# Patient Record
Sex: Female | Born: 1955 | Race: Black or African American | Hispanic: No | Marital: Single | Smoking: Never smoker
Health system: Southern US, Community
[De-identification: ages and names within clinical notes are randomized; demographics above are authoritative.]

## PROBLEM LIST (undated history)

## (undated) DIAGNOSIS — Z789 Other specified health status: Secondary | ICD-10-CM

## (undated) DIAGNOSIS — R87619 Unspecified abnormal cytological findings in specimens from cervix uteri: Secondary | ICD-10-CM

## (undated) DIAGNOSIS — R87629 Unspecified abnormal cytological findings in specimens from vagina: Secondary | ICD-10-CM

## (undated) DIAGNOSIS — R232 Flushing: Secondary | ICD-10-CM

## (undated) DIAGNOSIS — Z79890 Hormone replacement therapy (postmenopausal): Secondary | ICD-10-CM

## (undated) DIAGNOSIS — R195 Other fecal abnormalities: Secondary | ICD-10-CM

## (undated) HISTORY — DX: Unspecified abnormal cytological findings in specimens from vagina: R87.629

## (undated) HISTORY — DX: Other fecal abnormalities: R19.5

## (undated) HISTORY — PX: BREAST BIOPSY: SHX20

## (undated) HISTORY — DX: Flushing: R23.2

## (undated) HISTORY — PX: TUBAL LIGATION: SHX77

## (undated) HISTORY — DX: Unspecified abnormal cytological findings in specimens from cervix uteri: R87.619

## (undated) HISTORY — PX: ABDOMINAL SURGERY: SHX537

## (undated) HISTORY — DX: Hormone replacement therapy: Z79.890

---

## 2002-01-22 ENCOUNTER — Emergency Department (HOSPITAL_COMMUNITY): Admission: EM | Admit: 2002-01-22 | Discharge: 2002-01-23 | Payer: Self-pay | Admitting: Internal Medicine

## 2002-01-23 ENCOUNTER — Encounter: Payer: Self-pay | Admitting: Internal Medicine

## 2002-04-28 ENCOUNTER — Emergency Department (HOSPITAL_COMMUNITY): Admission: EM | Admit: 2002-04-28 | Discharge: 2002-04-28 | Payer: Self-pay | Admitting: Emergency Medicine

## 2002-04-28 ENCOUNTER — Encounter: Payer: Self-pay | Admitting: Emergency Medicine

## 2003-02-08 ENCOUNTER — Ambulatory Visit (HOSPITAL_COMMUNITY): Admission: RE | Admit: 2003-02-08 | Discharge: 2003-02-08 | Payer: Self-pay | Admitting: Obstetrics & Gynecology

## 2003-02-08 ENCOUNTER — Encounter: Payer: Self-pay | Admitting: Obstetrics & Gynecology

## 2003-04-12 ENCOUNTER — Emergency Department (HOSPITAL_COMMUNITY): Admission: EM | Admit: 2003-04-12 | Discharge: 2003-04-12 | Payer: Self-pay | Admitting: Emergency Medicine

## 2003-09-03 ENCOUNTER — Emergency Department (HOSPITAL_COMMUNITY): Admission: EM | Admit: 2003-09-03 | Discharge: 2003-09-03 | Payer: Self-pay | Admitting: Emergency Medicine

## 2004-02-16 ENCOUNTER — Ambulatory Visit (HOSPITAL_COMMUNITY): Admission: RE | Admit: 2004-02-16 | Discharge: 2004-02-16 | Payer: Self-pay | Admitting: Obstetrics and Gynecology

## 2004-07-25 ENCOUNTER — Emergency Department (HOSPITAL_COMMUNITY): Admission: EM | Admit: 2004-07-25 | Discharge: 2004-07-25 | Payer: Self-pay | Admitting: Emergency Medicine

## 2005-02-12 ENCOUNTER — Ambulatory Visit (HOSPITAL_COMMUNITY): Admission: RE | Admit: 2005-02-12 | Discharge: 2005-02-12 | Payer: Self-pay | Admitting: Obstetrics and Gynecology

## 2005-03-28 ENCOUNTER — Emergency Department (HOSPITAL_COMMUNITY): Admission: EM | Admit: 2005-03-28 | Discharge: 2005-03-28 | Payer: Self-pay | Admitting: Emergency Medicine

## 2005-03-29 ENCOUNTER — Encounter: Admission: RE | Admit: 2005-03-29 | Discharge: 2005-03-29 | Payer: Self-pay | Admitting: Oncology

## 2005-03-29 ENCOUNTER — Encounter (HOSPITAL_COMMUNITY): Admission: RE | Admit: 2005-03-29 | Discharge: 2005-04-28 | Payer: Self-pay | Admitting: Oncology

## 2005-03-29 ENCOUNTER — Ambulatory Visit (HOSPITAL_COMMUNITY): Payer: Self-pay | Admitting: Orthopaedic Surgery

## 2005-04-01 ENCOUNTER — Ambulatory Visit (HOSPITAL_COMMUNITY): Payer: Self-pay | Admitting: Oncology

## 2006-04-01 ENCOUNTER — Emergency Department (HOSPITAL_COMMUNITY): Admission: EM | Admit: 2006-04-01 | Discharge: 2006-04-01 | Payer: Self-pay | Admitting: Emergency Medicine

## 2006-04-28 ENCOUNTER — Ambulatory Visit (HOSPITAL_COMMUNITY): Admission: RE | Admit: 2006-04-28 | Discharge: 2006-04-28 | Payer: Self-pay | Admitting: Obstetrics and Gynecology

## 2007-05-19 ENCOUNTER — Ambulatory Visit (HOSPITAL_COMMUNITY): Admission: RE | Admit: 2007-05-19 | Discharge: 2007-05-19 | Payer: Self-pay | Admitting: Obstetrics and Gynecology

## 2007-07-15 ENCOUNTER — Other Ambulatory Visit: Admission: RE | Admit: 2007-07-15 | Discharge: 2007-07-15 | Payer: Self-pay | Admitting: Obstetrics and Gynecology

## 2008-05-23 ENCOUNTER — Ambulatory Visit (HOSPITAL_COMMUNITY): Admission: RE | Admit: 2008-05-23 | Discharge: 2008-05-23 | Payer: Self-pay | Admitting: Obstetrics and Gynecology

## 2008-11-29 ENCOUNTER — Other Ambulatory Visit: Admission: RE | Admit: 2008-11-29 | Discharge: 2008-11-29 | Payer: Self-pay | Admitting: Obstetrics and Gynecology

## 2009-10-29 ENCOUNTER — Emergency Department (HOSPITAL_COMMUNITY): Admission: EM | Admit: 2009-10-29 | Discharge: 2009-10-29 | Payer: Self-pay | Admitting: Emergency Medicine

## 2010-05-09 ENCOUNTER — Other Ambulatory Visit: Admission: RE | Admit: 2010-05-09 | Discharge: 2010-05-09 | Payer: Self-pay | Admitting: Obstetrics & Gynecology

## 2010-11-03 ENCOUNTER — Encounter: Payer: Self-pay | Admitting: Specialist

## 2010-11-03 ENCOUNTER — Encounter: Payer: Self-pay | Admitting: Internal Medicine

## 2011-02-28 NOTE — Consult Note (Signed)
NAMEMarland Krueger  [name], [name]            ACCOUNT NO.:  [id number]   MEDICAL RECORD NO.:  [id number]          PATIENT TYPE:  EMS   LOCATION:  ED                            FACILITY:  [facility]   PHYSICIAN:  J. Darreld Mclean, M.D. DATE OF BIRTH:  1956/06/06   DATE OF CONSULTATION:  03/28/2005  DATE OF DISCHARGE:                                   CONSULTATION   ORTHOPEDIC SURGERY CONSULTATION:  Patient was seen in the ER.  Requested by  the ER physician.   Patient is a 55 year old female who got a splinter caught in her left thumb  area near the metacarpophalangeal joint Sunday this week, today is Friday,  she has had pain, tenderness and swelling, she thought she got the splinter  out but she has swelling.  ER physician looked at it, he called Dr.  Metro Kung on call for hand and my partners asked me to take a look at it.  Patient has got a very small little punctate area with yellow and then some  slight redness around it, her white count is normal, her temperature is  afebrile.  Range of motion of the thumb is full but it is somewhat tender.   With patient permission I used a small 21-gauge needle and made a small  little nick over the area where the yellow part was, applied a little  pressure and got some purulent yellow material out, no evidence of any  splinter was seen, cleansed the area and put a dressing on it and put it in  a thumb splint, began IV Rocephin, given IV Rocephin 1 g today and daily.  I  will see her in the office on Monday, patient information booklet given,  prescription for Vicodin ES was given, if this gets worse we may need to do  a formal surgical debridement in the OR, to try to make an incision to look  for a small splinter would be very difficult here since the area is so  small.  She said she picked at it earlier the other day and she made  herself a superficial infection, and there may indeed be a small foreign  body.  The x-rays are negative of course.  If any  trouble or difficulty come  back to the emergency room this weekend, she will be coming back daily for  IV antibiotics and I will see her on Monday.       JWK/MEDQ  D:  03/28/2005  T:  03/28/2005  Job:  [id number]

## 2011-06-10 ENCOUNTER — Encounter: Payer: Self-pay | Admitting: Emergency Medicine

## 2011-06-10 ENCOUNTER — Emergency Department (HOSPITAL_COMMUNITY): Payer: Medicaid Other

## 2011-06-10 ENCOUNTER — Emergency Department (HOSPITAL_COMMUNITY)
Admission: EM | Admit: 2011-06-10 | Discharge: 2011-06-10 | Disposition: A | Discharge status: Home / Self Care | Payer: Medicaid Other | Attending: Emergency Medicine | Admitting: Emergency Medicine

## 2011-06-10 DIAGNOSIS — J069 Acute upper respiratory infection, unspecified: Secondary | ICD-10-CM | POA: Insufficient documentation

## 2011-06-10 MED ORDER — OXYMETAZOLINE HCL 0.05 % NA SOLN: 2.0000 | Freq: Two times a day (BID) | NASAL | Status: AC

## 2011-06-10 MED ORDER — PREDNISONE 10 MG PO TABS: ORAL_TABLET | ORAL | Status: DC

## 2011-06-10 MED ORDER — PROMETHAZINE-CODEINE 6.25-10 MG/5ML PO SYRP: 5.0000 mL | ORAL_SOLUTION | Freq: Four times a day (QID) | ORAL | Status: AC | PRN
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
## 2011-06-10 NOTE — ED Notes (Signed)
Pt c/o productive yellow sputum cough x 1 week. C/o soreness to ears, chest and head also. Nad. No resp distress noted.

## 2011-06-11 NOTE — ED Provider Notes (Signed)
Medical screening examination/treatment/procedure(s) were performed by non-physician practitioner and as supervising physician I was immediately available for consultation/collaboration.  Tiesha Marich, MD 06/11/11 2140 

## 2011-07-30 ENCOUNTER — Other Ambulatory Visit (HOSPITAL_COMMUNITY): Payer: Self-pay | Admitting: Family Medicine

## 2011-07-30 DIAGNOSIS — Z139 Encounter for screening, unspecified: Secondary | ICD-10-CM

## 2011-08-04 ENCOUNTER — Ambulatory Visit (HOSPITAL_COMMUNITY)
Admission: RE | Admit: 2011-08-04 | Discharge: 2011-08-04 | Disposition: A | Discharge status: Home / Self Care | Payer: Medicaid Other | Source: Ambulatory Visit | Attending: Family Medicine | Admitting: Family Medicine

## 2011-08-04 DIAGNOSIS — Z139 Encounter for screening, unspecified: Secondary | ICD-10-CM

## 2011-08-04 DIAGNOSIS — Z1231 Encounter for screening mammogram for malignant neoplasm of breast: Secondary | ICD-10-CM | POA: Insufficient documentation

## 2012-06-22 ENCOUNTER — Telehealth: Payer: Self-pay | Admitting: *Deleted

## 2012-06-22 NOTE — Telephone Encounter (Signed)
Michelle Krueger called to set up her colonoscopy. Please call her back. Thanks.

## 2012-06-22 NOTE — Telephone Encounter (Signed)
Gastroenterology Pre-Procedure Form    Request Date: 06/22/2012      Requesting Physician: Dr. Felecia Shelling     PATIENT INFORMATION:  Michelle Krueger is a 56 y.o., female (DOB=09-Jul-1956).  PROCEDURE: Procedure(s) requested: colonoscopy Procedure Reason: screening for colon cancer  PATIENT REVIEW QUESTIONS: The patient reports the following:   1. Diabetes Melitis: no 2. Joint replacements in the past 12 months: no 3. Major health problems in the past 3 months: no 4. Has an artificial valve or MVP:no 5. Has been advised in past to take antibiotics in advance of a procedure like teeth cleaning: no}    MEDICATIONS & ALLERGIES:    Patient reports the following regarding taking any blood thinners:   Plavix? no Aspirin?no Coumadin?  no  Patient confirms/reports the following medications:  Current Outpatient Prescriptions  Medication Sig Dispense Refill  . dextromethorphan (DELSYM) 30 MG/5ML liquid Take 60 mg by mouth 2 (two) times daily as needed. For cough       . predniSONE (DELTASONE) 10 MG tablet 6,5,4,3,2,1 - take wth food  21 tablet  0    Patient confirms/reports the following allergies:  Allergies  Allergen Reactions  . Ibuprofen Rash    Patient is appropriate to schedule for requested procedure(s): yes  AUTHORIZATION INFORMATION Primary Insurance:  ID #: Group #:  Pre-Cert / Auth required: Pre-Cert / Auth #:   Secondary Insurance:   ID #:   Group #:  Pre-Cert / Auth required: Pre-Cert / Auth #:   No orders of the defined types were placed in this encounter.    SCHEDULE INFORMATION: Procedure has been scheduled as follows:  Date:               Time:   Location: [facility] Short Stay  This Gastroenterology Pre-Precedure Form is being routed to the following provider(s) for review: Jonette Eva, MD

## 2012-06-22 NOTE — Telephone Encounter (Signed)
PREPOPIK-DRINK WATER TO KEEP URINE LIGHT YELLOW.  PT SHOULD DROP OFF RX 3 DAYS PRIOR TO PROCEDURE.  

## 2012-06-22 NOTE — Telephone Encounter (Signed)
Returned pt's call. Many rings and no answer.  

## 2012-06-23 ENCOUNTER — Other Ambulatory Visit: Payer: Self-pay

## 2012-06-23 DIAGNOSIS — Z139 Encounter for screening, unspecified: Secondary | ICD-10-CM

## 2012-06-23 MED ORDER — SOD PICOSULFATE-MAG OX-CIT ACD 10-3.5-12 MG-GM-GM PO PACK: 1.0000 | PACK | Freq: Once | ORAL | Status: DC

## 2012-06-23 NOTE — Telephone Encounter (Signed)
See separate note. Rx sent in and instructions mailed to pt.

## 2012-06-23 NOTE — Telephone Encounter (Signed)
Called to tell pt date and time of procedure. Could not leave message. Mailing the info and Rx being sent to [facility]. She is scheduled for 07/16/2012 @ 9:30 AM with Dr. Darrick Penna for her colonoscopy.

## 2012-06-28 ENCOUNTER — Other Ambulatory Visit: Payer: Self-pay | Admitting: Obstetrics and Gynecology

## 2012-06-28 DIAGNOSIS — Z139 Encounter for screening, unspecified: Secondary | ICD-10-CM

## 2012-07-13 ENCOUNTER — Encounter (HOSPITAL_COMMUNITY): Payer: Self-pay | Admitting: Pharmacy Technician

## 2012-07-16 ENCOUNTER — Ambulatory Visit (HOSPITAL_COMMUNITY)
Admission: RE | Admit: 2012-07-16 | Discharge: 2012-07-16 | Disposition: A | Discharge status: Home / Self Care | Payer: Medicaid Other | Source: Ambulatory Visit | Attending: Gastroenterology | Admitting: Gastroenterology

## 2012-07-16 ENCOUNTER — Encounter (HOSPITAL_COMMUNITY): Payer: Self-pay | Admitting: *Deleted

## 2012-07-16 ENCOUNTER — Encounter (HOSPITAL_COMMUNITY)
Admission: RE | Disposition: A | Discharge status: Home / Self Care | Payer: Self-pay | Source: Ambulatory Visit | Attending: Gastroenterology

## 2012-07-16 DIAGNOSIS — D126 Benign neoplasm of colon, unspecified: Secondary | ICD-10-CM

## 2012-07-16 DIAGNOSIS — K648 Other hemorrhoids: Secondary | ICD-10-CM

## 2012-07-16 DIAGNOSIS — Z1211 Encounter for screening for malignant neoplasm of colon: Secondary | ICD-10-CM | POA: Insufficient documentation

## 2012-07-16 DIAGNOSIS — Z139 Encounter for screening, unspecified: Secondary | ICD-10-CM

## 2012-07-16 HISTORY — PX: COLONOSCOPY: SHX5424

## 2012-07-16 HISTORY — DX: Other specified health status: Z78.9

## 2012-07-16 SURGERY — COLONOSCOPY
Anesthesia: Moderate Sedation

## 2012-07-16 MED ORDER — MIDAZOLAM HCL 5 MG/5ML IJ SOLN
INTRAMUSCULAR | Status: DC | PRN
  Administered 2012-07-16 (×2): 2 mg via INTRAVENOUS

## 2012-07-16 MED ORDER — MEPERIDINE HCL 100 MG/ML IJ SOLN
INTRAMUSCULAR | Status: AC
  Filled 2012-07-16: qty 1

## 2012-07-16 MED ORDER — MEPERIDINE HCL 100 MG/ML IJ SOLN
INTRAMUSCULAR | Status: DC | PRN
  Administered 2012-07-16 (×2): 25 mg via INTRAVENOUS

## 2012-07-16 MED ORDER — SODIUM CHLORIDE 0.45 % IV SOLN
INTRAVENOUS | Status: DC
  Administered 2012-07-16: 10:00:00 via INTRAVENOUS

## 2012-07-16 MED ORDER — STERILE WATER FOR IRRIGATION IR SOLN
Status: DC | PRN
  Administered 2012-07-16: 10:00:00

## 2012-07-16 MED ORDER — MIDAZOLAM HCL 5 MG/5ML IJ SOLN
INTRAMUSCULAR | Status: AC
  Filled 2012-07-16: qty 10

## 2012-07-16 NOTE — Op Note (Signed)
[facility] [address] [place], [address]   COLONOSCOPY PROCEDURE REPORT  PATIENT: Michelle Krueger, Michelle Krueger  MR#: [id number] BIRTHDATE: 06/06/1956 , 56  yrs. old GENDER: Female ENDOSCOPIST: Jonette Eva, MD REFERRED XB:[id number] Fanta, M.D. PROCEDURE DATE:  07/16/2012 PROCEDURE:   Colonoscopy with biopsy INDICATIONS:average risk patient for colon cancer. MEDICATIONS: Demerol 50 mg IV and Versed 4 mg IV  DESCRIPTION OF PROCEDURE:    Physical exam was performed.  Informed consent was obtained from the patient after explaining the benefits, risks, and alternatives to procedure.  The patient was connected to monitor and placed in left lateral position. Continuous oxygen was provided by nasal cannula and IV medicine administered through an indwelling cannula.  After administration of sedation and rectal exam, the patients rectum was intubated and the EC-[id number] ([id number])  colonoscope was advanced under direct visualization to the cecum.  The scope was removed slowly by carefully examining the color, texture, anatomy, and integrity mucosa on the way out.  The patient was recovered in endoscopy and discharged home in satisfactory condition.       COLON FINDINGS: A sessile polyp ranging between 3-85mm in size was found in the sigmoid colon.  A polypectomy was performed with cold forceps.  , The colon was otherwise normal.  There was no diverticulosis, inflammation, polyps or cancers unless previously stated.  , and Small internal hemorrhoids were found.    REDUNDANT SIGMOID/TRANSVERSE COLON.  PREP QUALITY: excellent. CECAL W/D TIME: 12 minutes  COMPLICATIONS: None  ENDOSCOPIC IMPRESSION: 1.   Sessile polyp ranging between 3-72mm in size was found in the sigmoid colon; polypectomy was performed with cold forceps 2.   The colon was otherwise normal 3.   Small internal hemorrhoids   RECOMMENDATIONS: FOLLOW A HIGH FIBER DIET.  Avoid items that cause  bloating.  BIOPSY RESULTS SHOULD BE BACK IN 7 DAYS.  Next colonoscopy in 10 yearS WITH AN OVERTUBE.       _______________________________ Rosalie DoctorJonette Eva, MD 07/16/2012 3:19 PM     PATIENT NAME:  Michelle, Krueger MR#: [id number]

## 2012-07-16 NOTE — H&P (Signed)
  Primary Care Physician:  Avon Gully, MD Primary Gastroenterologist:  Dr. Darrick Penna  Pre-Procedure History & Physical: HPI:  Michelle Krueger is a 56 y.o. female here for COLON CANCER SCREENING.   Past Medical History  Diagnosis Date  . No pertinent past medical history     Past Surgical History  Procedure Date  . Tubal ligation     Prior to Admission medications   Not on File    Allergies as of 06/23/2012 - Review Complete 06/22/2012  Allergen Reaction Noted  . Ibuprofen Rash 06/10/2011    History reviewed. No pertinent family history.  History   Social History  . Marital Status: Single    Spouse Name: N/A    Number of Children: N/A  . Years of Education: N/A   Occupational History  . Not on file.   Social History Main Topics  . Smoking status: Never Smoker   . Smokeless tobacco: Not on file  . Alcohol Use: No  . Drug Use: No  . Sexually Active:    Other Topics Concern  . Not on file   Social History Narrative  . No narrative on file    Review of Systems: See HPI, otherwise negative ROS   Physical Exam: BP 141/80  Pulse 74  Temp 97.5 F (36.4 C) (Oral)  Resp 17  Ht 4\' 9"  (1.448 m)  Wt 128 lb (58.06 kg)  BMI 27.70 kg/m2  SpO2 98%  LMP 04/29/2011 General:   Alert,  pleasant and cooperative in NAD Head:  Normocephalic and atraumatic. Neck:  Supple; Lungs:  Clear throughout to auscultation.    Heart:  Regular rate and rhythm. Abdomen:  Soft, nontender and nondistended. Normal bowel sounds, without guarding, and without rebound.   Neurologic:  Alert and  oriented x4;  grossly normal neurologically.  Impression/Plan:     SCREENING  Plan:  1. TCS TODAY

## 2012-07-21 ENCOUNTER — Encounter (HOSPITAL_COMMUNITY): Payer: Self-pay | Admitting: Gastroenterology

## 2012-07-22 ENCOUNTER — Telehealth: Payer: Self-pay | Admitting: Gastroenterology

## 2012-07-22 NOTE — Telephone Encounter (Signed)
Called. Not accepting calls at this time. Mailing a letter to call.

## 2012-07-22 NOTE — Telephone Encounter (Signed)
Please call pt. She had a polypoid lesion removed and it was benign. High fiber diet. TCS in 10 years.  

## 2012-07-22 NOTE — Telephone Encounter (Signed)
Faxed to PCP, recall made  

## 2012-07-28 NOTE — Telephone Encounter (Signed)
Pt called and was informed.  

## 2012-08-06 ENCOUNTER — Ambulatory Visit (HOSPITAL_COMMUNITY)
Admission: RE | Admit: 2012-08-06 | Discharge: 2012-08-06 | Disposition: A | Discharge status: Home / Self Care | Payer: Medicaid Other | Source: Ambulatory Visit | Attending: Obstetrics and Gynecology | Admitting: Obstetrics and Gynecology

## 2012-08-06 DIAGNOSIS — Z1231 Encounter for screening mammogram for malignant neoplasm of breast: Secondary | ICD-10-CM | POA: Insufficient documentation

## 2012-08-06 DIAGNOSIS — Z139 Encounter for screening, unspecified: Secondary | ICD-10-CM

## 2012-08-13 ENCOUNTER — Encounter (HOSPITAL_COMMUNITY): Payer: Self-pay | Admitting: Emergency Medicine

## 2012-08-13 ENCOUNTER — Emergency Department (HOSPITAL_COMMUNITY)
Admission: EM | Admit: 2012-08-13 | Discharge: 2012-08-13 | Disposition: A | Discharge status: Home / Self Care | Payer: Medicaid Other | Attending: Emergency Medicine | Admitting: Emergency Medicine

## 2012-08-13 DIAGNOSIS — B029 Zoster without complications: Secondary | ICD-10-CM | POA: Insufficient documentation

## 2012-08-13 MED ORDER — IBUPROFEN 600 MG PO TABS: 600.0000 mg | ORAL_TABLET | Freq: Four times a day (QID) | ORAL | Status: DC | PRN

## 2012-08-13 MED ORDER — ACYCLOVIR 400 MG PO TABS: 800.0000 mg | ORAL_TABLET | Freq: Four times a day (QID) | ORAL | Status: DC

## 2012-08-13 MED ORDER — OXYCODONE-ACETAMINOPHEN 5-325 MG PO TABS
1.0000 | ORAL_TABLET | Freq: Once | ORAL | Status: AC
  Administered 2012-08-13: 1 via ORAL
  Filled 2012-08-13: qty 1

## 2012-08-13 NOTE — ED Notes (Signed)
Rash to lt axillae that pt says extends down chest.  Discomfort lt arm and shoulder.

## 2012-08-13 NOTE — ED Provider Notes (Signed)
Medical screening examination/treatment/procedure(s) were performed by non-physician practitioner and as supervising physician I was immediately available for consultation/collaboration.   Joya Gaskins, MD 08/13/12 2226

## 2012-08-13 NOTE — ED Notes (Signed)
Used a spray deodorant and now has rash with pain and itching under arms and breasts.
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
## 2013-07-04 ENCOUNTER — Other Ambulatory Visit (HOSPITAL_COMMUNITY): Payer: Self-pay | Admitting: Dermatology

## 2013-07-04 DIAGNOSIS — Z139 Encounter for screening, unspecified: Secondary | ICD-10-CM

## 2013-08-11 ENCOUNTER — Encounter (HOSPITAL_COMMUNITY): Payer: Self-pay | Admitting: Emergency Medicine

## 2013-08-11 ENCOUNTER — Emergency Department (HOSPITAL_COMMUNITY)
Admission: EM | Admit: 2013-08-11 | Discharge: 2013-08-11 | Disposition: A | Discharge status: Home / Self Care | Payer: Medicaid Other | Attending: Emergency Medicine | Admitting: Emergency Medicine

## 2013-08-11 DIAGNOSIS — M25669 Stiffness of unspecified knee, not elsewhere classified: Secondary | ICD-10-CM | POA: Insufficient documentation

## 2013-08-11 DIAGNOSIS — M25561 Pain in right knee: Secondary | ICD-10-CM

## 2013-08-11 DIAGNOSIS — M25569 Pain in unspecified knee: Secondary | ICD-10-CM | POA: Insufficient documentation

## 2013-08-11 MED ORDER — DICLOFENAC SODIUM 75 MG PO TBEC: 75.0000 mg | DELAYED_RELEASE_TABLET | Freq: Two times a day (BID) | ORAL | Status: DC

## 2013-08-11 MED ORDER — DEXAMETHASONE 6 MG PO TABS: ORAL_TABLET | ORAL | Status: DC

## 2013-08-11 NOTE — ED Notes (Signed)
Pain in R knee x 2 weeks, worsened today.  Has not been taking anything for pain, nor using ice.  Pain is worse with standing and walking, only aches with lying.

## 2013-08-11 NOTE — ED Provider Notes (Signed)
Medical screening examination/treatment/procedure(s) were performed by non-physician practitioner and as supervising physician I was immediately available for consultation/collaboration.  EKG Interpretation   None        Donnetta Hutching, MD 08/11/13 1820

## 2013-08-11 NOTE — ED Notes (Signed)
C/o pain to right leg x 1 wk.  Denies injury.
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
## 2013-08-11 NOTE — ED Notes (Signed)
Patient with no complaints at this time. Respirations even and unlabored. Skin warm/dry. Discharge instructions reviewed with patient at this time. Patient given opportunity to voice concerns/ask questions. Patient discharged at this time and left Emergency Department with steady gait.   

## 2013-08-12 ENCOUNTER — Telehealth: Payer: Self-pay | Admitting: Orthopedic Surgery

## 2013-08-12 ENCOUNTER — Ambulatory Visit (HOSPITAL_COMMUNITY)
Admission: RE | Admit: 2013-08-12 | Discharge: 2013-08-12 | Disposition: A | Discharge status: Home / Self Care | Payer: Medicaid Other | Source: Ambulatory Visit | Attending: Dermatology | Admitting: Dermatology

## 2013-08-12 DIAGNOSIS — Z1231 Encounter for screening mammogram for malignant neoplasm of breast: Secondary | ICD-10-CM | POA: Insufficient documentation

## 2013-08-12 DIAGNOSIS — Z139 Encounter for screening, unspecified: Secondary | ICD-10-CM

## 2013-08-12 NOTE — Telephone Encounter (Signed)
Call from patient following Emergency Room Visit at [facility] for problem of right knee pain, states started about 1 month ago.  Reviewed with patient that, due to her insurance, referral is required from primary care physician; upon receipt of referral, we will be able to schedule appointment.  Patient said she will call primary care, Dr. Felecia Shelling.

## 2013-08-17 ENCOUNTER — Other Ambulatory Visit: Payer: Self-pay | Admitting: Dermatology

## 2013-08-17 DIAGNOSIS — R928 Other abnormal and inconclusive findings on diagnostic imaging of breast: Secondary | ICD-10-CM

## 2013-09-14 ENCOUNTER — Encounter (HOSPITAL_COMMUNITY): Payer: Medicaid Other

## 2013-09-21 ENCOUNTER — Ambulatory Visit (HOSPITAL_COMMUNITY)
Admission: RE | Admit: 2013-09-21 | Discharge: 2013-09-21 | Disposition: A | Discharge status: Home / Self Care | Payer: Medicaid Other | Source: Ambulatory Visit | Attending: Dermatology | Admitting: Dermatology

## 2013-09-21 ENCOUNTER — Other Ambulatory Visit: Payer: Self-pay | Admitting: Dermatology

## 2013-09-21 DIAGNOSIS — R928 Other abnormal and inconclusive findings on diagnostic imaging of breast: Secondary | ICD-10-CM

## 2013-09-29 ENCOUNTER — Ambulatory Visit (INDEPENDENT_AMBULATORY_CARE_PROVIDER_SITE_OTHER): Payer: Medicaid Other | Admitting: Orthopedic Surgery

## 2013-09-29 ENCOUNTER — Ambulatory Visit (INDEPENDENT_AMBULATORY_CARE_PROVIDER_SITE_OTHER): Payer: Medicaid Other

## 2013-09-29 VITALS — BP 122/79 | Ht <= 58 in | Wt 126.0 lb

## 2013-09-29 DIAGNOSIS — M25569 Pain in unspecified knee: Secondary | ICD-10-CM

## 2013-09-29 DIAGNOSIS — M25561 Pain in right knee: Secondary | ICD-10-CM

## 2013-09-29 MED ORDER — DICLOFENAC SODIUM 75 MG PO TBEC: 75.0000 mg | DELAYED_RELEASE_TABLET | Freq: Two times a day (BID) | ORAL | Status: DC

## 2013-09-29 NOTE — Patient Instructions (Signed)
Pick up your prescription at the pharmacy 

## 2013-10-01 NOTE — Progress Notes (Signed)
Patient ID: Michelle Krueger, female   DOB: 08-11-1956, 57 y.o.   MRN: [id number] Chief Complaint  Patient presents with  . Knee Pain    Right knee pain, no injury    Knee Pain  The incident occurred 5 to 7 days ago. There was no injury mechanism. The pain is present in the right knee. The quality of the pain is described as aching. The pain is moderate. The pain has been fluctuating since onset. Pertinent negatives include no inability to bear weight, loss of motion, loss of sensation, muscle weakness, numbness or tingling.   Review of Systems  Neurological: Negative for tingling and numbness.  All other systems reviewed and are negative.   BP 122/79  Ht 4\' 9"  (1.448 m)  Wt 57.153 kg (126 lb)  BMI 27.26 kg/m2  LMP 04/29/2011 General appearance is normal, the patient is alert and oriented x3 with normal mood and affect. Ectomorphic body  Gait normal  On evaluation of the right knee we do see and palpate medial and lateral joint line tenderness without effusion, passive range of motion is normal. All ligaments were stable. Motor exam grade 5. Skin intact. Meniscal signs negative. Pulse normal. Normal sensation. No lymphadenopathy.  X-rays show degenerative changes no acute fracture  Impression arthritis of the knee.

## 2014-07-13 ENCOUNTER — Ambulatory Visit (INDEPENDENT_AMBULATORY_CARE_PROVIDER_SITE_OTHER): Payer: Medicaid Other | Admitting: Adult Health

## 2014-07-13 ENCOUNTER — Other Ambulatory Visit (HOSPITAL_COMMUNITY)
Admission: RE | Admit: 2014-07-13 | Discharge: 2014-07-13 | Disposition: A | Discharge status: Home / Self Care | Payer: Medicaid Other | Source: Ambulatory Visit | Attending: Adult Health | Admitting: Adult Health

## 2014-07-13 ENCOUNTER — Encounter: Payer: Self-pay | Admitting: Adult Health

## 2014-07-13 VITALS — BP 100/60 | HR 78 | Ht <= 58 in | Wt 130.0 lb

## 2014-07-13 DIAGNOSIS — Z124 Encounter for screening for malignant neoplasm of cervix: Secondary | ICD-10-CM | POA: Insufficient documentation

## 2014-07-13 DIAGNOSIS — Z0000 Encounter for general adult medical examination without abnormal findings: Secondary | ICD-10-CM

## 2014-07-13 DIAGNOSIS — Z1151 Encounter for screening for human papillomavirus (HPV): Secondary | ICD-10-CM | POA: Diagnosis present

## 2014-07-13 DIAGNOSIS — R195 Other fecal abnormalities: Secondary | ICD-10-CM

## 2014-07-13 DIAGNOSIS — Z01419 Encounter for gynecological examination (general) (routine) without abnormal findings: Secondary | ICD-10-CM

## 2014-07-13 HISTORY — DX: Other fecal abnormalities: R19.5

## 2014-07-13 LAB — HEMOCCULT GUIAC POC 1CARD (OFFICE): Fecal Occult Blood, POC: POSITIVE

## 2014-07-13 NOTE — Patient Instructions (Signed)
3 card hemoccult cards and bring back Physical in 1 year Mammogram yearly

## 2014-07-13 NOTE — Progress Notes (Signed)
Patient ID: Michelle Krueger, female   DOB: 1955-12-11, 58 y.o.   MRN: [id number] History of Present Illness: Michelle Krueger is a 58 year old black female in for a pap and physical.   Current Medications, Allergies, Past Medical History, Past Surgical History, Family History and Social History were reviewed in [facility] record.     Review of Systems: Patient denies any headaches, blurred vision, shortness of breath, chest pain, abdominal pain, problems with bowel movements, urination, or intercourse. No having sex, has pain in right knee sees Dr Clide Deutscher mood swings.   Physical Exam:BP 100/60  Pulse 78  Ht 4\' 9"  (1.448 m)  Wt 130 lb (58.968 kg)  BMI 28.12 kg/m2  LMP 04/29/2011 General:  Well developed, well nourished, no acute distress Skin:  Warm and dry Neck:  Midline trachea, normal thyroid Lungs; Clear to auscultation bilaterally Breast:  No dominant palpable mass, retraction, or nipple discharge Cardiovascular: Regular rate and rhythm Abdomen:  Soft, non tender, no hepatosplenomegaly Pelvic:  External genitalia is normal in appearance.  The vagina has decrease color, moisture and rugae.  The cervix is smooth.  Uterus is felt to be normal size, shape, and contour.  No                adnexal masses or tenderness noted. Rectal: Good sphincter tone, no polyps, or hemorrhoids felt.  Hemoccult positive. Extremities:  No swelling or varicosities noted Psych:  No mood changes, alert and cooperative, seems happy Has had colonoscopy about 2 years ago.  Impression: Well woman gyn exam Positive hemoccult    Plan: Will send 3 hemoccult cards home today Physical in 1 year Mammogram yearly

## 2014-07-17 LAB — CYTOLOGY - PAP

## 2014-07-20 ENCOUNTER — Telehealth: Payer: Self-pay | Admitting: Adult Health

## 2014-07-20 NOTE — Telephone Encounter (Signed)
Invalid number if she calls repeat pap in 1 year

## 2014-08-11 ENCOUNTER — Other Ambulatory Visit: Payer: Self-pay | Admitting: Adult Health

## 2014-08-11 DIAGNOSIS — Z1231 Encounter for screening mammogram for malignant neoplasm of breast: Secondary | ICD-10-CM

## 2014-08-14 ENCOUNTER — Ambulatory Visit (HOSPITAL_COMMUNITY): Payer: Medicaid Other

## 2014-08-14 ENCOUNTER — Encounter: Payer: Self-pay | Admitting: Adult Health

## 2014-09-27 ENCOUNTER — Ambulatory Visit (HOSPITAL_COMMUNITY): Payer: Medicaid Other

## 2015-02-19 ENCOUNTER — Other Ambulatory Visit: Payer: Self-pay | Admitting: Adult Health

## 2015-02-19 DIAGNOSIS — Z1231 Encounter for screening mammogram for malignant neoplasm of breast: Secondary | ICD-10-CM

## 2015-03-09 ENCOUNTER — Ambulatory Visit (HOSPITAL_COMMUNITY)
Admission: RE | Admit: 2015-03-09 | Discharge: 2015-03-09 | Disposition: A | Discharge status: Home / Self Care | Payer: Medicaid Other | Source: Ambulatory Visit | Attending: Adult Health | Admitting: Adult Health

## 2015-03-09 DIAGNOSIS — Z1231 Encounter for screening mammogram for malignant neoplasm of breast: Secondary | ICD-10-CM | POA: Diagnosis present

## 2015-07-17 ENCOUNTER — Other Ambulatory Visit (HOSPITAL_COMMUNITY)
Admission: RE | Admit: 2015-07-17 | Discharge: 2015-07-17 | Disposition: A | Discharge status: Home / Self Care | Payer: Medicaid Other | Source: Ambulatory Visit | Attending: Adult Health | Admitting: Adult Health

## 2015-07-17 ENCOUNTER — Ambulatory Visit (INDEPENDENT_AMBULATORY_CARE_PROVIDER_SITE_OTHER): Payer: Medicaid Other | Admitting: Adult Health

## 2015-07-17 ENCOUNTER — Encounter: Payer: Self-pay | Admitting: Adult Health

## 2015-07-17 VITALS — BP 118/78 | HR 60 | Ht 61.0 in | Wt 124.0 lb

## 2015-07-17 DIAGNOSIS — Z01419 Encounter for gynecological examination (general) (routine) without abnormal findings: Secondary | ICD-10-CM | POA: Insufficient documentation

## 2015-07-17 DIAGNOSIS — Z1151 Encounter for screening for human papillomavirus (HPV): Secondary | ICD-10-CM | POA: Diagnosis not present

## 2015-07-17 DIAGNOSIS — Z1212 Encounter for screening for malignant neoplasm of rectum: Secondary | ICD-10-CM | POA: Diagnosis not present

## 2015-07-17 DIAGNOSIS — R232 Flushing: Secondary | ICD-10-CM

## 2015-07-17 DIAGNOSIS — Z0000 Encounter for general adult medical examination without abnormal findings: Secondary | ICD-10-CM

## 2015-07-17 DIAGNOSIS — Z79890 Hormone replacement therapy (postmenopausal): Secondary | ICD-10-CM

## 2015-07-17 HISTORY — DX: Flushing: R23.2

## 2015-07-17 HISTORY — DX: Hormone replacement therapy: Z79.890

## 2015-07-17 LAB — HEMOCCULT GUIAC POC 1CARD (OFFICE): Fecal Occult Blood, POC: NEGATIVE

## 2015-07-17 MED ORDER — NORETHINDRONE-ETH ESTRADIOL 0.5-2.5 MG-MCG PO TABS: 1.0000 | ORAL_TABLET | Freq: Every day | ORAL | Status: DC

## 2015-07-17 NOTE — Progress Notes (Signed)
Patient ID: [name], female   DOB: 24-Dec-1955, 59 y.o.   MRN: [id number] History of Present Illness: Michelle Krueger is a 59 year old black female in for a well woman gyn exam and pap.She is complaining of hot flashes.Had ASCUS pap 07/13/14 with negative HPV. PCP is Dr Michelle Krueger.  Current Medications, Allergies, Past Medical History, Past Surgical History, Family History and Social History were reviewed in [employer] record.     Review of Systems: .Patient denies any headaches, hearing loss, fatigue, blurred vision, shortness of breath, chest pain, abdominal pain, problems with bowel movements, urination, or intercourse(not having sex). No joint pain or mood swings.See HPI for positives.    Physical Exam:BP 118/78 mmHg  Pulse 60  Ht 5\' 1"  (1.549 m)  Wt 124 lb (56.246 kg)  BMI 23.44 kg/m2  LMP 04/29/2011 General:  Well developed, well nourished, no acute distress Skin:  Warm and dry Neck:  Midline trachea, normal thyroid, good ROM, no lymphadenopathy Lungs; Clear to auscultation bilaterally Breast:  No dominant palpable mass, retraction, or nipple discharge Cardiovascular: Regular rate and rhythm Abdomen:  Soft, non tender, no hepatosplenomegaly Pelvic:  External genitalia is normal in appearance, no lesions.  The vagina is pale with loss of moisture and rugae. Urethra has no lesions or masses. The cervix is smooth, pap with HPV performed.  Uterus is felt to be normal size, shape, and contour.  No adnexal masses or tenderness noted.Bladder is non tender, no masses felt. Rectal: Good sphincter tone, no polyps, or hemorrhoids felt.  Hemoccult negative. Extremities/musculoskeletal:  No swelling or varicosities noted, no clubbing or cyanosis Psych:  No mood changes, alert and cooperative,seems happy Discussed HRT, with risks and benefits and she wants to try.  Impression: Well woman gyn exam and pap Hot flashes HRT    Plan: Rx femhrt low dose #30 take 1 daily  with refills Physical in 1 year Mammogram yearly Colonoscopy per GI Follow up in 3 months on HRT  Review handout on HRT

## 2015-07-17 NOTE — Patient Instructions (Addendum)
Physical in 1 year Mammogram yearly Hormone Therapy At menopause, your body begins making less estrogen and progesterone hormones. This causes the body to stop having menstrual periods. This is because estrogen and progesterone hormones control your periods and menstrual cycle. A lack of estrogen may cause symptoms such as:  Hot flushes (or hot flashes).  Vaginal dryness.  Dry skin.  Loss of sex drive.  Risk of bone loss (osteoporosis). When this happens, you may choose to take hormone therapy to get back the estrogen lost during menopause. When the hormone estrogen is given alone, it is usually referred to as ET (Estrogen Therapy). When the hormone progestin is combined with estrogen, it is generally called HT (Hormone Therapy). This was formerly known as hormone replacement therapy (HRT). Your caregiver can help you make a decision on what will be best for you. The decision to use HT seems to change often as new studies are done. Many studies do not agree on the benefits of hormone replacement therapy. LIKELY BENEFITS OF HT INCLUDE PROTECTION FROM:  Hot Flushes (also called hot flashes) - A hot flush is a sudden feeling of heat that spreads over the face and body. The skin may redden like a blush. It is connected with sweats and sleep disturbance. Women going through menopause may have hot flushes a few times a month or several times per day depending on the woman.  Osteoporosis (bone loss)- Estrogen helps guard against bone loss. After menopause, a woman's bones slowly lose calcium and become weak and brittle. As a result, bones are more likely to break. The hip, wrist, and spine are affected most often. Hormone therapy can help slow bone loss after menopause. Weight bearing exercise and taking calcium with vitamin D also can help prevent bone loss. There are also medications that your caregiver can prescribe that can help prevent osteoporosis.  Vaginal Dryness - Loss of estrogen causes  changes in the vagina. Its lining may become thin and dry. These changes can cause pain and bleeding during sexual intercourse. Dryness can also lead to infections. This can cause burning and itching. (Vaginal estrogen treatment can help relieve pain, itching, and dryness.)  Urinary Tract Infections are more common after menopause because of lack of estrogen. Some women also develop urinary incontinence because of low estrogen levels in the vagina and bladder.  Possible other benefits of estrogen include a positive effect on mood and short-term memory in women. RISKS AND COMPLICATIONS  Using estrogen alone without progesterone causes the lining of the uterus to grow. This increases the risk of lining of the uterus (endometrial) cancer. Your caregiver should give another hormone called progestin if you have a uterus.  Women who take combined (estrogen and progestin) HT appear to have an increased risk of breast cancer. The risk appears to be small, but increases throughout the time that HT is taken.  Combined therapy also makes the breast tissue slightly denser which makes it harder to read mammograms (breast X-rays).  Combined, estrogen and progesterone therapy can be taken together every day, in which case there may be spotting of blood. HT therapy can be taken cyclically in which case you will have menstrual periods. Cyclically means HT is taken for a set amount of days, then not taken, then this process is repeated.  HT may increase the risk of stroke, heart attack, breast cancer and forming blood clots in your leg.  Transdermal estrogen (estrogen that is absorbed through the skin with a patch or a cream)  may have more positive results with:  Cholesterol.  Blood pressure.  Blood clots. Having the following conditions may indicate you should not have HT:  Endometrial cancer.  Liver disease.  Breast cancer.  Heart disease.  History of blood clots.  Stroke. TREATMENT   If you  choose to take HT and have a uterus, usually estrogen and progestin are prescribed.  Your caregiver will help you decide the best way to take the medications.  Possible ways to take estrogen include:  Pills.  Patches.  Gels.  Sprays.  Vaginal estrogen cream, rings and tablets.  It is best to take the lowest dose possible that will help your symptoms and take them for the shortest period of time that you can.  Hormone therapy can help relieve some of the problems (symptoms) that affect women at menopause. Before making a decision about HT, talk to your caregiver about what is best for you. Be well informed and comfortable with your decisions. HOME CARE INSTRUCTIONS   Follow your caregivers advice when taking the medications.  A Pap test is done to screen for cervical cancer.  The first Pap test should be done at age 54.  Between ages 41 and 67, Pap tests are repeated every 2 years.  Beginning at age 68, you are advised to have a Pap test every 3 years as long as your past 3 Pap tests have been normal.  Some women have medical problems that increase the chance of getting cervical cancer. Talk to your caregiver about these problems. It is especially important to talk to your caregiver if a new problem develops soon after your last Pap test. In these cases, your caregiver may recommend more frequent screening and Pap tests.  The above recommendations are the same for women who have or have not gotten the vaccine for HPV (Human Papillomavirus).  If you had a hysterectomy for a problem that was not a cancer or a condition that could lead to cancer, then you no longer need Pap tests. However, even if you no longer need a Pap test, a regular exam is a good idea to make sure no other problems are starting.   If you are between ages 47 and 41, and you have had normal Pap tests going back 10 years, you no longer need Pap tests. However, even if you no longer need a Pap test, a regular  exam is a good idea to make sure no other problems are starting.   If you have had past treatment for cervical cancer or a condition that could lead to cancer, you need Pap tests and screening for cancer for at least 20 years after your treatment.  If Pap tests have been discontinued, risk factors (such as a new sexual partner) need to be re-assessed to determine if screening should be resumed.  Some women may need screenings more often if they are at high risk for cervical cancer.  Get mammograms done as per the advice of your caregiver. SEEK IMMEDIATE MEDICAL CARE IF:  You develop abnormal vaginal bleeding.  You have pain or swelling in your legs, shortness of breath, or chest pain.  You develop dizziness or headaches.  You have lumps or changes in your breasts or armpits.  You have slurred speech.  You develop weakness or numbness of your arms or legs.  You have pain, burning, or bleeding when urinating.  You develop abdominal pain. Document Released: 06/28/2003 Document Revised: 12/22/2011 Document Reviewed: 10/16/2010 ExitCare Patient Information 2015 [employer]. This information is not intended to replace advice given to you by your health care provider. Make sure you discuss any questions you have with your health care provider.  follow up in 3 months

## 2015-07-19 LAB — CYTOLOGY - PAP

## 2015-07-23 ENCOUNTER — Telehealth: Payer: Self-pay | Admitting: Adult Health

## 2015-07-23 NOTE — Telephone Encounter (Signed)
Pt aware pap showed LSIL, get colpo to appt made

## 2015-07-31 ENCOUNTER — Encounter: Payer: Self-pay | Admitting: Obstetrics & Gynecology

## 2015-07-31 ENCOUNTER — Other Ambulatory Visit: Payer: Self-pay | Admitting: Obstetrics & Gynecology

## 2015-07-31 ENCOUNTER — Ambulatory Visit (INDEPENDENT_AMBULATORY_CARE_PROVIDER_SITE_OTHER): Payer: Medicaid Other | Admitting: Obstetrics & Gynecology

## 2015-07-31 VITALS — BP 110/80 | HR 60 | Wt 125.0 lb

## 2015-07-31 DIAGNOSIS — R896 Abnormal cytological findings in specimens from other organs, systems and tissues: Secondary | ICD-10-CM

## 2015-07-31 DIAGNOSIS — N870 Mild cervical dysplasia: Secondary | ICD-10-CM

## 2015-07-31 DIAGNOSIS — IMO0002 Reserved for concepts with insufficient information to code with codable children: Secondary | ICD-10-CM

## 2015-07-31 NOTE — Addendum Note (Signed)
Addended by: Diona Fanti A on: 07/31/2015 04:24 PM   Modules accepted: Orders

## 2015-07-31 NOTE — Progress Notes (Signed)
Patient ID: [name], female   DOB: 1956-01-08, 59 y.o.   MRN: [id number] Colposcopy Procedure Note:  Colposcopy Procedure Note  Indications: Pap smear 1 months ago showed: ASC cannot exclude high grade lesion [facility]). The prior pap showed atypical squamous cellularity of undetermined significance (ASCUS).  Prior cervical/vaginal disease: . Prior cervical treatment: .  Smoker:  No. New sexual partner:  No.  : time frame:    History of abnormal Pap: yes   Procedure Details  The risks and benefits of the procedure and Written informed consent obtained.  Speculum placed in vagina and excellent visualization of cervix achieved, cervix swabbed x 3 with acetic acid solution.  Findings:  Inadequate colposcopy Cervix: no ectocervical lesion, there is dense acetowhite changes in the endocervical canal I tried to biopsy but could not and did ECC which I think is inadequate; endocervical lesion noted at 12 o'clock. Vaginal inspection: vaginal colposcopy not performed. Vulvar colposcopy: vulvar colposcopy not performed.  Specimens: ECC  Complications: none.  Plan: Follow up 1 week  Inadequate, will need a laser conization of the cervix

## 2015-08-07 ENCOUNTER — Ambulatory Visit: Payer: Medicaid Other | Admitting: Obstetrics & Gynecology

## 2015-08-14 ENCOUNTER — Telehealth: Payer: Self-pay | Admitting: *Deleted

## 2015-08-14 NOTE — Telephone Encounter (Signed)
-----   Message from Florian Buff, MD sent at 08/08/2015  6:02 PM EDT ----- Pt missed her follow up appt with me and she needs to be scheduled for a laser of the cervix She needs to come in for follow up and pre op  Thanks  Dr Elonda Husky ----- Message -----    From: Lab in [facility] [place] [facility] Interface    Sent: 08/02/2015   3:46 PM      To: Florian Buff, MD

## 2015-08-14 NOTE — Telephone Encounter (Signed)
Pt scheduled for an appt for 08/17/2015 at 53 with Dr. Elonda Husky for follow up an pre op.

## 2015-08-17 ENCOUNTER — Ambulatory Visit (INDEPENDENT_AMBULATORY_CARE_PROVIDER_SITE_OTHER): Payer: Medicaid Other | Admitting: Obstetrics & Gynecology

## 2015-08-17 ENCOUNTER — Encounter: Payer: Self-pay | Admitting: Obstetrics & Gynecology

## 2015-08-17 VITALS — BP 148/84 | HR 80 | Ht 60.0 in | Wt 125.0 lb

## 2015-08-17 DIAGNOSIS — R896 Abnormal cytological findings in specimens from other organs, systems and tissues: Secondary | ICD-10-CM

## 2015-08-17 DIAGNOSIS — IMO0002 Reserved for concepts with insufficient information to code with codable children: Secondary | ICD-10-CM

## 2015-08-17 NOTE — Patient Instructions (Signed)
Michelle Krueger  08/17/2015     @PREFPERIOPPHARMACY @   Your procedure is scheduled on  08/22/2015  Report to [facility] at  700  A.M.  Call this number if you have problems the morning of surgery:  [phone]   Remember:  Do not eat food or drink liquids after midnight.  Take these medicines the morning of surgery with A SIP OF WATER : none   Do not wear jewelry, make-up or nail polish.  Do not wear lotions, powders, or perfumes.  You may wear deodorant.  Do not shave 48 hours prior to surgery.  Men may shave face and neck.  Do not bring valuables to the hospital.  [facility] is not responsible for any belongings or valuables.  Contacts, dentures or bridgework may not be worn into surgery.  Leave your suitcase in the car.  After surgery it may be brought to your room.  For patients admitted to the hospital, discharge time will be determined by your treatment team.  Patients discharged the day of surgery will not be allowed to drive home.   Name and phone number of your driver:   family Special instructions:  none  Please read over the following fact sheets that you were given. Pain Booklet, Coughing and Deep Breathing, Surgical Site Infection Prevention, Anesthesia Post-op Instructions and Care and Recovery After Surgery      Conization of the Cervix Cervical conization is the cutting (excision) of a cone-shaped portion of the cervix. The procedure is performed through the vagina in either your health care provider's office or an operating room. This procedure is usually done when there is abnormal bleeding from the cervix. It can also be done to evaluate an abnormal Pap test or if an abnormality is seen on the cervix during an exam. The tissue is then examined to see if there are precancerous cells or cancer present.  Conization of the cervix is not done during a menstrual period or pregnancy.  LET [facility] CARE PROVIDER KNOW ABOUT:  Any allergies you have.    All medicines you are taking, including vitamins, herbs, eye drops, creams, and over-the-counter medicines.   Previous problems you or members of your family have had with the use of anesthetics.   Any blood disorders you have.   Previous surgeries you have had.   Medical conditions you have.   Your smoking habits.   The possibility of being pregnant.  RISKS AND COMPLICATIONS  Generally, conization of the cervix is a safe procedure. However, as with any procedure, complications can occur. Possible complications include:  Heavy bleeding several days or weeks after the procedure. Light bleeding or spotting after the procedure is normal.  Infection (rare).  Damage to the cervix or surrounding organs (uncommon).   Problems with the anesthesia.   Increased risk of preterm labor in future pregnancies. BEFORE THE PROCEDURE  Do not eat or drink anything for 6-8 hours before the procedure.   Do not take aspirin or blood thinners for at least a week before the procedure or as directed by your health care provider.   Arrange for someone to take you home after the procedure.  PROCEDURE There are three different methods to perform conization of the cervix. These include:   The cold knife method. In this method a small cone-shaped sample of tissue is cut out with a knife (scalpel) from the cervical canal and the transformation zone (where the normal cells end and  the abnormal cells begin).   The LEEP method. In this method a small cone-shaped sample of tissue is cut out with a thin wire that can burn (cauterize) the cervical tissue with an electrical current.   Laser treatment. In this method a small cone-shaped sample of tissue is cut out and then cauterized with a laser beam to prevent bleeding.  The procedure will be performed as follows:   Depending on the method, you will either be given a medicine to make you sleep (general anesthetic) or a numbing medicine  (local anesthetic). A medicine that numbs the cervix (cervical block) may be given.   A lubricated device called a speculum will be inserted into the vagina to spread open the walls of the vagina. This will help your health care provider see the inside of the vagina and cervix better.   The tissue from the cervix will be removed and examined.   The results of the procedure will help your health care provider decide if further treatment is necessary. They will also help your health care provider decide on the best treatment if your results are abnormal. AFTER THE PROCEDURE  If you had a general anesthetic, you may be groggy for 2-3 hours after the procedure.   If you had a local anesthetic, you will rest at the clinic or hospital until you are stable and feel ready to go home.   Recovery may take up to 3 weeks.   You may have some cramping for about 1 week.   You may have bloody discharge or light bleeding for 1-2 weeks.   You may have black discharge coming from the vagina. This is from the paste used on the cervix to prevent bleeding. This is normal discharge.    This information is not intended to replace advice given to you by your health care provider. Make sure you discuss any questions you have with your health care provider.   Document Released: 07/09/2005 Document Revised: 10/04/2013 Document Reviewed: 03/25/2013 Elsevier Interactive Patient Education 2016 [employer] PATIENT INSTRUCTIONS POST-ANESTHESIA  IMMEDIATELY FOLLOWING SURGERY:  Do not drive or operate machinery for the first twenty four hours after surgery.  Do not make any important decisions for twenty four hours after surgery or while taking narcotic pain medications or sedatives.  If you develop intractable nausea and vomiting or a severe headache please notify your doctor immediately.  FOLLOW-UP:  Please make an appointment with your surgeon as instructed. You do not need to follow up with anesthesia  unless specifically instructed to do so.  WOUND CARE INSTRUCTIONS (if applicable):  Keep a dry clean dressing on the anesthesia/puncture wound site if there is drainage.  Once the wound has quit draining you may leave it open to air.  Generally you should leave the bandage intact for twenty four hours unless there is drainage.  If the epidural site drains for more than 36-48 hours please call the anesthesia department.  QUESTIONS?:  Please feel free to call your physician or the hospital operator if you have any questions, and they will be happy to assist you.
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
## 2015-08-20 ENCOUNTER — Encounter (HOSPITAL_COMMUNITY)
Admission: RE | Admit: 2015-08-20 | Discharge: 2015-08-20 | Disposition: A | Discharge status: Home / Self Care | Payer: Medicaid Other | Source: Ambulatory Visit | Attending: Obstetrics & Gynecology | Admitting: Obstetrics & Gynecology

## 2015-08-20 DIAGNOSIS — Z01818 Encounter for other preprocedural examination: Secondary | ICD-10-CM | POA: Diagnosis present

## 2015-08-20 DIAGNOSIS — N879 Dysplasia of cervix uteri, unspecified: Secondary | ICD-10-CM | POA: Insufficient documentation

## 2015-08-20 LAB — COMPREHENSIVE METABOLIC PANEL
ALK PHOS: 57 U/L (ref 38–126)
ALT: 15 U/L (ref 14–54)
AST: 19 U/L (ref 15–41)
Albumin: 4.2 g/dL (ref 3.5–5.0)
Anion gap: 10 (ref 5–15)
BILIRUBIN TOTAL: 0.4 mg/dL (ref 0.3–1.2)
BUN: 18 mg/dL (ref 6–20)
CALCIUM: 9.6 mg/dL (ref 8.9–10.3)
CO2: 26 mmol/L (ref 22–32)
CREATININE: 0.67 mg/dL (ref 0.44–1.00)
Chloride: 102 mmol/L (ref 101–111)
Glucose, Bld: 86 mg/dL (ref 65–99)
Potassium: 4 mmol/L (ref 3.5–5.1)
Sodium: 138 mmol/L (ref 135–145)
Total Protein: 7.5 g/dL (ref 6.5–8.1)

## 2015-08-20 LAB — CBC
HEMATOCRIT: 38.7 % (ref 36.0–46.0)
HEMOGLOBIN: 12.8 g/dL (ref 12.0–15.0)
MCH: 30.5 pg (ref 26.0–34.0)
MCHC: 33.1 g/dL (ref 30.0–36.0)
MCV: 92.1 fL (ref 78.0–100.0)
Platelets: 357 10*3/uL (ref 150–400)
RBC: 4.2 MIL/uL (ref 3.87–5.11)
RDW: 12.6 % (ref 11.5–15.5)
WBC: 5.9 10*3/uL (ref 4.0–10.5)

## 2015-08-21 ENCOUNTER — Other Ambulatory Visit: Payer: Self-pay | Admitting: Obstetrics & Gynecology

## 2015-08-22 ENCOUNTER — Ambulatory Visit (HOSPITAL_COMMUNITY)
Admission: AD | Admit: 2015-08-22 | Discharge: 2015-08-22 | Disposition: A | Discharge status: Home / Self Care | Payer: Medicaid Other | Source: Ambulatory Visit | Attending: Obstetrics & Gynecology | Admitting: Obstetrics & Gynecology

## 2015-08-22 ENCOUNTER — Ambulatory Visit (HOSPITAL_COMMUNITY): Payer: Medicaid Other | Admitting: Anesthesiology

## 2015-08-22 ENCOUNTER — Encounter (HOSPITAL_COMMUNITY): Payer: Self-pay | Admitting: *Deleted

## 2015-08-22 ENCOUNTER — Encounter (HOSPITAL_COMMUNITY)
Admission: AD | Disposition: A | Discharge status: Home / Self Care | Payer: Self-pay | Source: Ambulatory Visit | Attending: Obstetrics & Gynecology

## 2015-08-22 DIAGNOSIS — R87612 Low grade squamous intraepithelial lesion on cytologic smear of cervix (LGSIL): Secondary | ICD-10-CM | POA: Diagnosis present

## 2015-08-22 DIAGNOSIS — N870 Mild cervical dysplasia: Secondary | ICD-10-CM | POA: Diagnosis not present

## 2015-08-22 DIAGNOSIS — I10 Essential (primary) hypertension: Secondary | ICD-10-CM | POA: Insufficient documentation

## 2015-08-22 DIAGNOSIS — Z79890 Hormone replacement therapy (postmenopausal): Secondary | ICD-10-CM | POA: Insufficient documentation

## 2015-08-22 DIAGNOSIS — Z79899 Other long term (current) drug therapy: Secondary | ICD-10-CM | POA: Diagnosis not present

## 2015-08-22 HISTORY — PX: CERVICAL CONIZATION W/BX: SHX1330

## 2015-08-22 HISTORY — PX: HOLMIUM LASER APPLICATION: SHX5852

## 2015-08-22 SURGERY — CONE BIOPSY, CERVIX
Anesthesia: General

## 2015-08-22 MED ORDER — MIDAZOLAM HCL 2 MG/2ML IJ SOLN
1.0000 mg | INTRAMUSCULAR | Status: DC | PRN
  Administered 2015-08-22: 2 mg via INTRAVENOUS
  Filled 2015-08-22: qty 2

## 2015-08-22 MED ORDER — PROPOFOL 10 MG/ML IV BOLUS
INTRAVENOUS | Status: DC | PRN
  Administered 2015-08-22: 120 mg via INTRAVENOUS

## 2015-08-22 MED ORDER — ONDANSETRON HCL 4 MG/2ML IJ SOLN
4.0000 mg | Freq: Once | INTRAMUSCULAR | Status: AC
Start: 2015-08-22 — End: 2015-08-22
  Administered 2015-08-22: 4 mg via INTRAVENOUS
  Filled 2015-08-22: qty 2

## 2015-08-22 MED ORDER — WATER FOR IRRIGATION, STERILE IR SOLN
Status: DC | PRN
  Administered 2015-08-22: 1000 mL

## 2015-08-22 MED ORDER — FERRIC SUBSULFATE SOLN
Status: DC | PRN
  Administered 2015-08-22: 2

## 2015-08-22 MED ORDER — ACETIC ACID 5 % SOLN
Status: DC | PRN
  Administered 2015-08-22: 1 via TOPICAL

## 2015-08-22 MED ORDER — HYDROCODONE-ACETAMINOPHEN 5-325 MG PO TABS
1.0000 | ORAL_TABLET | Freq: Four times a day (QID) | ORAL | Status: DC | PRN
Start: 2015-08-22 — End: 2015-09-13

## 2015-08-22 MED ORDER — LACTATED RINGERS IV SOLN
INTRAVENOUS | Status: DC
  Administered 2015-08-22: 1000 mL via INTRAVENOUS

## 2015-08-22 MED ORDER — BUPIVACAINE HCL (PF) 0.25 % IJ SOLN
INTRAMUSCULAR | Status: AC
  Filled 2015-08-22: qty 30

## 2015-08-22 MED ORDER — LIDOCAINE HCL (CARDIAC) 20 MG/ML IV SOLN
INTRAVENOUS | Status: DC | PRN
  Administered 2015-08-22: 30 mg via INTRAVENOUS

## 2015-08-22 MED ORDER — FENTANYL CITRATE (PF) 250 MCG/5ML IJ SOLN
INTRAMUSCULAR | Status: AC
  Filled 2015-08-22: qty 25

## 2015-08-22 MED ORDER — ONDANSETRON HCL 4 MG/2ML IJ SOLN: 4.0000 mg | Freq: Once | INTRAMUSCULAR | Status: DC | PRN

## 2015-08-22 MED ORDER — FENTANYL CITRATE (PF) 100 MCG/2ML IJ SOLN
25.0000 ug | INTRAMUSCULAR | Status: AC
  Administered 2015-08-22 (×2): 25 ug via INTRAVENOUS
  Filled 2015-08-22 (×2): qty 2

## 2015-08-22 MED ORDER — LIDOCAINE HCL (PF) 1 % IJ SOLN
INTRAMUSCULAR | Status: AC
  Filled 2015-08-22: qty 5

## 2015-08-22 MED ORDER — PROPOFOL 10 MG/ML IV BOLUS
INTRAVENOUS | Status: AC
  Filled 2015-08-22: qty 20

## 2015-08-22 MED ORDER — FENTANYL CITRATE (PF) 250 MCG/5ML IJ SOLN
INTRAMUSCULAR | Status: DC | PRN
  Administered 2015-08-22: 50 ug via INTRAVENOUS

## 2015-08-22 MED ORDER — ONDANSETRON HCL 8 MG PO TABS: 8.0000 mg | ORAL_TABLET | Freq: Three times a day (TID) | ORAL | Status: DC | PRN

## 2015-08-22 MED ORDER — CEFAZOLIN SODIUM-DEXTROSE 2-3 GM-% IV SOLR
2.0000 g | INTRAVENOUS | Status: AC
  Administered 2015-08-22: 2 g via INTRAVENOUS
  Filled 2015-08-22: qty 50

## 2015-08-22 MED ORDER — FENTANYL CITRATE (PF) 100 MCG/2ML IJ SOLN: 25.0000 ug | INTRAMUSCULAR | Status: DC | PRN

## 2015-08-22 SURGICAL SUPPLY — 26 items
APPLICATOR COTTON TIP 6IN STRL (MISCELLANEOUS) ×3 IMPLANT
CLOTH BEACON ORANGE TIMEOUT ST (SAFETY) ×3 IMPLANT
COVER LIGHT HANDLE STERIS (MISCELLANEOUS) ×6 IMPLANT
ELECT REM PT RETURN 9FT ADLT (ELECTROSURGICAL) ×3
ELECTRODE REM PT RTRN 9FT ADLT (ELECTROSURGICAL) ×1 IMPLANT
FORMALIN 10 PREFIL 120ML (MISCELLANEOUS) ×3 IMPLANT
GAUZE SPONGE 4X4 16PLY XRAY LF (GAUZE/BANDAGES/DRESSINGS) ×3 IMPLANT
GLOVE BIOGEL PI IND STRL 7.0 (GLOVE) ×1 IMPLANT
GLOVE BIOGEL PI IND STRL 8 (GLOVE) ×1 IMPLANT
GLOVE BIOGEL PI INDICATOR 7.0 (GLOVE) ×2
GLOVE BIOGEL PI INDICATOR 8 (GLOVE) ×2
GLOVE ECLIPSE 6.5 STRL STRAW (GLOVE) ×3 IMPLANT
GLOVE ECLIPSE 8.0 STRL XLNG CF (GLOVE) ×3 IMPLANT
GOWN STRL REUS W/TWL LRG LVL3 (GOWN DISPOSABLE) ×3 IMPLANT
GOWN STRL REUS W/TWL XL LVL3 (GOWN DISPOSABLE) ×3 IMPLANT
KIT ROOM TURNOVER APOR (KITS) ×3 IMPLANT
LASER FIBER DISP 1000U (UROLOGICAL SUPPLIES) ×3 IMPLANT
MANIFOLD NEPTUNE II (INSTRUMENTS) ×3 IMPLANT
NS IRRIG 1000ML POUR BTL (IV SOLUTION) ×3 IMPLANT
PACK PERI GYN (CUSTOM PROCEDURE TRAY) ×3 IMPLANT
PAD ARMBOARD 7.5X6 YLW CONV (MISCELLANEOUS) ×3 IMPLANT
PREFILTER SMOKE EVAC (FILTER) ×3 IMPLANT
SET BASIN LINEN APH (SET/KITS/TRAYS/PACK) ×3 IMPLANT
TOWEL OR 17X26 4PK STRL BLUE (TOWEL DISPOSABLE) ×3 IMPLANT
TUBING SMOKE EVAC CO2 (TUBING) ×3 IMPLANT
WATER STERILE IRR 1000ML POUR (IV SOLUTION) ×3 IMPLANT

## 2015-08-22 NOTE — Discharge Instructions (Signed)
Conization of the Cervix  Cervical conization is the cutting (excision) of a cone-shaped portion of the cervix. The procedure is performed through the vagina in either your health care provider's office or an operating room. This procedure is usually done when there is abnormal bleeding from the cervix. It can also be done to evaluate an abnormal Pap test or if an abnormality is seen on the cervix during an exam. The tissue is then examined to see if there are precancerous cells or cancer present.   Conization of the cervix is not done during a menstrual period or pregnancy.   LET YOUR HEALTH CARE PROVIDER KNOW ABOUT:  · Any allergies you have.    · All medicines you are taking, including vitamins, herbs, eye drops, creams, and over-the-counter medicines.    · Previous problems you or members of your family have had with the use of anesthetics.    · Any blood disorders you have.    · Previous surgeries you have had.    · Medical conditions you have.    · Your smoking habits.    · The possibility of being pregnant.    RISKS AND COMPLICATIONS   Generally, conization of the cervix is a safe procedure. However, as with any procedure, complications can occur. Possible complications include:  · Heavy bleeding several days or weeks after the procedure. Light bleeding or spotting after the procedure is normal.  · Infection (rare).  · Damage to the cervix or surrounding organs (uncommon).    · Problems with the anesthesia.    · Increased risk of preterm labor in future pregnancies.  BEFORE THE PROCEDURE  · Do not eat or drink anything for 6-8 hours before the procedure.    · Do not take aspirin or blood thinners for at least a week before the procedure or as directed by your health care provider.    · Arrange for someone to take you home after the procedure.    PROCEDURE  There are three different methods to perform conization of the cervix. These include:   · The cold knife method. In this method a small cone-shaped sample  of tissue is cut out with a knife (scalpel) from the cervical canal and the transformation zone (where the normal cells end and the abnormal cells begin).    · The LEEP method. In this method a small cone-shaped sample of tissue is cut out with a thin wire that can burn (cauterize) the cervical tissue with an electrical current.    · Laser treatment. In this method a small cone-shaped sample of tissue is cut out and then cauterized with a laser beam to prevent bleeding.    The procedure will be performed as follows:   · Depending on the method, you will either be given a medicine to make you sleep (general anesthetic) or a numbing medicine (local anesthetic). A medicine that numbs the cervix (cervical block) may be given.    · A lubricated device called a speculum will be inserted into the vagina to spread open the walls of the vagina. This will help your health care provider see the inside of the vagina and cervix better.    · The tissue from the cervix will be removed and examined.    · The results of the procedure will help your health care provider decide if further treatment is necessary. They will also help your health care provider decide on the best treatment if your results are abnormal.  AFTER THE PROCEDURE  · If you had a general anesthetic, you may be groggy for 2-3 hours after   to 3 weeks.   You may have some cramping for about 1 week.   You may have bloody discharge or light bleeding for 1-2 weeks.   You may have black discharge coming from the vagina. This is from the paste used on the cervix to prevent bleeding. This is normal discharge.    This information is not intended to replace advice given to you by your health care provider. Make sure you discuss any questions you have with your health care provider.   Document  Released: 07/09/2005 Document Revised: 10/04/2013 Document Reviewed: 03/25/2013 Elsevier Interactive Patient Education 2016 [employer]   PATIENT INSTRUCTIONS POST-ANESTHESIA  IMMEDIATELY FOLLOWING SURGERY:  Do not drive or operate machinery for the first twenty four hours after surgery.  Do not make any important decisions for twenty four hours after surgery or while taking narcotic pain medications or sedatives.  If you develop intractable nausea and vomiting or a severe headache please notify your doctor immediately.  FOLLOW-UP:  Please make an appointment with your surgeon as instructed. You do not need to follow up with anesthesia unless specifically instructed to do so.  WOUND CARE INSTRUCTIONS (if applicable):  Keep a dry clean dressing on the anesthesia/puncture wound site if there is drainage.  Once the wound has quit draining you may leave it open to air.  Generally you should leave the bandage intact for twenty four hours unless there is drainage.  If the epidural site drains for more than 36-48 hours please call the anesthesia department.  QUESTIONS?:  Please feel free to call your physician or the hospital operator if you have any questions, and they will be happy to assist you.
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
## 2015-08-22 NOTE — Anesthesia Preprocedure Evaluation (Signed)
Anesthesia Evaluation  Patient identified by MRN, date of birth, ID band Patient awake    Reviewed: Allergy & Precautions, NPO status , Patient's Chart, lab work & pertinent test results  Airway Mallampati: I  TM Distance: >3 FB     Dental  (+) Teeth Intact   Pulmonary neg pulmonary ROS,    breath sounds clear to auscultation       Cardiovascular hypertension,  Rhythm:Regular Rate:Normal     Neuro/Psych    GI/Hepatic negative GI ROS,   Endo/Other    Renal/GU      Musculoskeletal   Abdominal   Peds  Hematology   Anesthesia Other Findings   Reproductive/Obstetrics                             Anesthesia Physical Anesthesia Plan  ASA: I  Anesthesia Plan: General   Post-op Pain Management:    Induction: Intravenous  Airway Management Planned: LMA  Additional Equipment:   Intra-op Plan:   Post-operative Plan: Extubation in OR  Informed Consent: I have reviewed the patients History and Physical, chart, labs and discussed the procedure including the risks, benefits and alternatives for the proposed anesthesia with the patient or authorized representative who has indicated his/her understanding and acceptance.     Plan Discussed with:   Anesthesia Plan Comments:         Anesthesia Quick Evaluation

## 2015-08-22 NOTE — Anesthesia Procedure Notes (Signed)
Procedure Name: LMA Insertion Date/Time: 08/22/2015 8:37 AM Performed by: Tressie Stalker E Pre-anesthesia Checklist: Patient identified, Patient being monitored, Emergency Drugs available, Timeout performed and Suction available Patient Re-evaluated:Patient Re-evaluated prior to inductionOxygen Delivery Method: Circle System Utilized Preoxygenation: Pre-oxygenation with 100% oxygen Intubation Type: IV induction Ventilation: Mask ventilation without difficulty LMA: LMA inserted LMA Size: 3.0 Number of attempts: 1 Placement Confirmation: positive ETCO2 and breath sounds checked- equal and bilateral

## 2015-08-22 NOTE — Op Note (Signed)
Preoperative diagnosis:  1. 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  Inadequate colposcopy with lesion emanating from the endocervical canal  Postoperative Diagnosis:  Same as above  Procedure:  Cervical conization using laser,  ablation of cervical bed using laser  Surgeon:  Jacelyn Grip MD  Anesthesia:  Laryngeal mask airway  Findings:  Pt had a colposcopy in the office which revealed an endocervical lesion, the extent which I could not determine.  The biopsy returned as LSIL.  As a result for both diagnostic and therapeutic indications I proceeded with a laser conization of the cervix Today at the time of surgery a repeat colposcopy was performed using 3% acetic acid and the lesion was once again confirmed.  There  were no new findings today.  Description of operation:  Patient was taken to the operating room and placed in the supine position where she underwent laryngeal mask airway anesthesia.  She was then placed in the high lithotomy position using candy cane stirrups.  She was then draped out for a laser procedure.  The microscope was used and 3% acetic acid was placed on the cervix.  The holmium laser was then employed at a power of 2.5 and rates between 8 and 15.  I achieved a couple millimeter margin around lesions both at 12:00 and 6:00 the laser was used to perform a conization.  The specimen was removed and sent to pathology for evaluation.  As is always the case with laser I did achieve at an appropriate margin around the disease with shrinkage of the tissue during the procedure it may appear to be a positive lateral margin.  However the surgical margin is indeed clear.  I then used the laser to ablate the conization bed to a depth of 5-7 mm laterally coning down to 9 mm centrally and began getting good surgical margin.  Additional hemostasis was achieved using Monsel solution.  In the conization bed was completely hemostatic.  Blood loss  for the procedure was none.  The patient received ancef preoperatively.  The patient was awakened from anesthesia and taken to the recovery room in good stable condition with all counts being correct.  She will be followed up in the office in one month for evaluation of the conization bed.  Florian Buff, MD 08/22/2015 9:23 AM

## 2015-08-22 NOTE — Anesthesia Postprocedure Evaluation (Signed)
  Anesthesia Post-op Note  Patient: Michelle Krueger  Procedure(s) Performed: Procedure(s): LASER CONIZATION OF THE CERVIX (N/A) HOLMIUM LASER APPLICATION (N/A)  Patient Location: Short Stay  Anesthesia Type:General  Level of Consciousness: awake, alert  and oriented  Airway and Oxygen Therapy: Patient Spontanous Breathing  Post-op Pain: none  Post-op Assessment: Post-op Vital signs reviewed, Patient's Cardiovascular Status Stable, Respiratory Function Stable, Patent Airway and No signs of Nausea or vomiting              Post-op Vital Signs: Reviewed and stable  Last Vitals:  Filed Vitals:   08/22/15 1030  BP: 142/83  Pulse: 64  Temp: 36.4 C  Resp: 20    Complications: No apparent anesthesia complications

## 2015-08-22 NOTE — Transfer of Care (Signed)
Immediate Anesthesia Transfer of Care Note  Patient: Michelle Krueger  Procedure(s) Performed: Procedure(s): LASER CONIZATION OF THE CERVIX (N/A) HOLMIUM LASER APPLICATION (N/A)  Patient Location: PACU  Anesthesia Type:General  Level of Consciousness: awake, alert  and oriented  Airway & Oxygen Therapy: Patient Spontanous Breathing and Patient connected to face mask oxygen  Post-op Assessment: Report given to RN  Post vital signs: Reviewed and stable  Last Vitals:  Filed Vitals:   08/22/15 0825  BP: 136/84  Temp:   Resp: 20    Complications: No apparent anesthesia complications

## 2015-08-23 ENCOUNTER — Encounter (HOSPITAL_COMMUNITY): Payer: Self-pay | Admitting: Obstetrics & Gynecology

## 2015-09-10 ENCOUNTER — Encounter: Payer: Medicaid Other | Admitting: Obstetrics & Gynecology

## 2015-09-13 ENCOUNTER — Ambulatory Visit (INDEPENDENT_AMBULATORY_CARE_PROVIDER_SITE_OTHER): Payer: Medicaid Other | Admitting: Obstetrics & Gynecology

## 2015-09-13 ENCOUNTER — Encounter: Payer: Self-pay | Admitting: Obstetrics & Gynecology

## 2015-09-13 VITALS — BP 120/70 | HR 84 | Wt 126.4 lb

## 2015-09-13 DIAGNOSIS — Z9889 Other specified postprocedural states: Secondary | ICD-10-CM

## 2015-09-13 NOTE — Progress Notes (Signed)
Patient ID: Michelle Krueger, female   DOB: August 29, 1956, 59 y.o.   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

## 2015-10-22 ENCOUNTER — Ambulatory Visit: Payer: Medicaid Other | Admitting: Adult Health

## 2015-10-24 ENCOUNTER — Ambulatory Visit: Payer: Medicaid Other | Admitting: Adult Health

## 2015-10-30 ENCOUNTER — Ambulatory Visit: Payer: Medicaid Other | Admitting: Adult Health

## 2015-10-31 ENCOUNTER — Ambulatory Visit: Payer: Medicaid Other | Admitting: Adult Health

## 2015-11-05 ENCOUNTER — Ambulatory Visit (INDEPENDENT_AMBULATORY_CARE_PROVIDER_SITE_OTHER): Payer: Medicaid Other | Admitting: Adult Health

## 2015-11-05 ENCOUNTER — Encounter: Payer: Self-pay | Admitting: Adult Health

## 2015-11-05 VITALS — BP 122/80 | HR 76 | Ht 61.5 in | Wt 129.0 lb

## 2015-11-05 DIAGNOSIS — Z79890 Hormone replacement therapy (postmenopausal): Secondary | ICD-10-CM

## 2015-11-05 DIAGNOSIS — R232 Flushing: Secondary | ICD-10-CM

## 2015-11-05 DIAGNOSIS — N951 Menopausal and female climacteric states: Secondary | ICD-10-CM

## 2015-11-05 MED ORDER — NORETHINDRONE-ETH ESTRADIOL 0.5-2.5 MG-MCG PO TABS: 1.0000 | ORAL_TABLET | Freq: Every day | ORAL | Status: DC

## 2015-11-05 NOTE — Progress Notes (Signed)
Subjective:     Patient ID: Michelle Krueger, female   DOB: [birth year], 60 y.o.   MRN: [id number]  HPI Michelle Krueger is a 60 year old black female back in follow up of starting femhrt, she ran out and wants refills.Says she is better on the HRT.  Review of Systems Patient denies any headaches, hearing loss, fatigue, blurred vision, shortness of breath, chest pain, abdominal pain, problems with bowel movements, urination, or intercourse. No joint pain or mood swings. Reviewed past medical,surgical, social and family history. Reviewed medications and allergies.     Objective:   Physical Exam BP 122/80 mmHg  Pulse 76  Ht 5' 1.5" (1.562 m)  Wt 129 lb (58.514 kg)  BMI 23.98 kg/m2  LMP 04/29/2011  Skin warm and dry. Neck: mid line trachea, normal thyroid, good ROM, no lymphadenopathy noted. Lungs: clear to ausculation bilaterally. Cardiovascular: regular rate and rhythm.   She wants to continue HRT as it mad big difference in her hot flashes and how she feels. Face time 10 minutes.  Assessment:     Hot flashes  HRT    Plan:     Refilled Femhrt take 1 daily with 9 refills Follow up in 9  Months for pap and physical

## 2015-11-05 NOTE — Patient Instructions (Signed)
Take femhrt Follow up in 9 months for pap and physical

## 2016-05-12 ENCOUNTER — Emergency Department (HOSPITAL_COMMUNITY)
Admission: EM | Admit: 2016-05-12 | Discharge: 2016-05-12 | Disposition: A | Discharge status: Home / Self Care | Payer: Medicaid Other | Attending: Emergency Medicine | Admitting: Emergency Medicine

## 2016-05-12 ENCOUNTER — Encounter (HOSPITAL_COMMUNITY): Payer: Self-pay | Admitting: Emergency Medicine

## 2016-05-12 DIAGNOSIS — S40861A Insect bite (nonvenomous) of right upper arm, initial encounter: Secondary | ICD-10-CM | POA: Diagnosis not present

## 2016-05-12 DIAGNOSIS — I10 Essential (primary) hypertension: Secondary | ICD-10-CM | POA: Diagnosis not present

## 2016-05-12 DIAGNOSIS — Y929 Unspecified place or not applicable: Secondary | ICD-10-CM | POA: Diagnosis not present

## 2016-05-12 DIAGNOSIS — W57XXXA Bitten or stung by nonvenomous insect and other nonvenomous arthropods, initial encounter: Secondary | ICD-10-CM | POA: Diagnosis not present

## 2016-05-12 DIAGNOSIS — Y999 Unspecified external cause status: Secondary | ICD-10-CM | POA: Insufficient documentation

## 2016-05-12 DIAGNOSIS — Y939 Activity, unspecified: Secondary | ICD-10-CM | POA: Diagnosis not present

## 2016-05-12 MED ORDER — DIPHENHYDRAMINE HCL 25 MG PO CAPS
25.0000 mg | ORAL_CAPSULE | Freq: Once | ORAL | Status: AC
  Administered 2016-05-12: 25 mg via ORAL
  Filled 2016-05-12: qty 1

## 2016-05-12 MED ORDER — DIPHENHYDRAMINE HCL 25 MG PO TABS: 25.0000 mg | ORAL_TABLET | ORAL | 0 refills | Status: DC | PRN

## 2016-05-12 MED ORDER — TRIAMCINOLONE ACETONIDE 0.1 % EX CREA: 1.0000 "application " | TOPICAL_CREAM | Freq: Three times a day (TID) | CUTANEOUS | 0 refills | Status: DC

## 2016-05-12 NOTE — Discharge Instructions (Signed)
Return to ER for any worsening symptoms such as fever, increasing redness or swelling of the area
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
## 2016-05-12 NOTE — ED Triage Notes (Signed)
PT c/o insect bite to right upper arm with clear drainage noted on Saturday evening. PT c/o itching at site.

## 2016-07-24 ENCOUNTER — Other Ambulatory Visit: Payer: Medicaid Other | Admitting: Adult Health

## 2016-08-05 ENCOUNTER — Other Ambulatory Visit: Payer: Medicaid Other | Admitting: Adult Health

## 2016-12-02 ENCOUNTER — Other Ambulatory Visit: Payer: Self-pay | Admitting: Adult Health

## 2016-12-02 DIAGNOSIS — Z1231 Encounter for screening mammogram for malignant neoplasm of breast: Secondary | ICD-10-CM

## 2016-12-15 ENCOUNTER — Other Ambulatory Visit: Payer: Medicaid Other | Admitting: Adult Health

## 2016-12-15 ENCOUNTER — Ambulatory Visit (HOSPITAL_COMMUNITY): Payer: Medicaid Other

## 2016-12-25 ENCOUNTER — Other Ambulatory Visit (HOSPITAL_COMMUNITY)
Admission: RE | Admit: 2016-12-25 | Discharge: 2016-12-25 | Disposition: A | Discharge status: Home / Self Care | Payer: Medicaid Other | Source: Ambulatory Visit | Attending: Adult Health | Admitting: Adult Health

## 2016-12-25 ENCOUNTER — Encounter: Payer: Self-pay | Admitting: Adult Health

## 2016-12-25 ENCOUNTER — Ambulatory Visit (INDEPENDENT_AMBULATORY_CARE_PROVIDER_SITE_OTHER): Payer: Medicaid Other | Admitting: Adult Health

## 2016-12-25 VITALS — BP 120/68 | HR 76 | Ht 60.25 in | Wt 131.0 lb

## 2016-12-25 DIAGNOSIS — Z1151 Encounter for screening for human papillomavirus (HPV): Secondary | ICD-10-CM | POA: Insufficient documentation

## 2016-12-25 DIAGNOSIS — Z01411 Encounter for gynecological examination (general) (routine) with abnormal findings: Secondary | ICD-10-CM | POA: Diagnosis present

## 2016-12-25 DIAGNOSIS — R232 Flushing: Secondary | ICD-10-CM

## 2016-12-25 DIAGNOSIS — Z1211 Encounter for screening for malignant neoplasm of colon: Secondary | ICD-10-CM

## 2016-12-25 DIAGNOSIS — Z01419 Encounter for gynecological examination (general) (routine) without abnormal findings: Secondary | ICD-10-CM | POA: Diagnosis not present

## 2016-12-25 DIAGNOSIS — Z1212 Encounter for screening for malignant neoplasm of rectum: Secondary | ICD-10-CM

## 2016-12-25 DIAGNOSIS — Z79890 Hormone replacement therapy (postmenopausal): Secondary | ICD-10-CM

## 2016-12-25 DIAGNOSIS — Z8742 Personal history of other diseases of the female genital tract: Secondary | ICD-10-CM

## 2016-12-25 DIAGNOSIS — Z0000 Encounter for general adult medical examination without abnormal findings: Secondary | ICD-10-CM

## 2016-12-25 DIAGNOSIS — Z9889 Other specified postprocedural states: Secondary | ICD-10-CM | POA: Insufficient documentation

## 2016-12-25 LAB — HEMOCCULT GUIAC POC 1CARD (OFFICE): Fecal Occult Blood, POC: NEGATIVE

## 2016-12-25 MED ORDER — NORETHINDRONE-ETH ESTRADIOL 0.5-2.5 MG-MCG PO TABS: 1.0000 | ORAL_TABLET | Freq: Every day | ORAL | 11 refills | Status: DC
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
## 2016-12-30 LAB — CYTOLOGY - PAP: HPV (WINDOPATH): NOT DETECTED

## 2016-12-31 ENCOUNTER — Telehealth: Payer: Self-pay | Admitting: Adult Health

## 2016-12-31 ENCOUNTER — Encounter: Payer: Self-pay | Admitting: Adult Health

## 2016-12-31 DIAGNOSIS — R87612 Low grade squamous intraepithelial lesion on cytologic smear of cervix (LGSIL): Secondary | ICD-10-CM

## 2016-12-31 DIAGNOSIS — R87619 Unspecified abnormal cytological findings in specimens from cervix uteri: Secondary | ICD-10-CM

## 2016-12-31 HISTORY — DX: Unspecified abnormal cytological findings in specimens from cervix uteri: R87.619

## 2016-12-31 NOTE — Telephone Encounter (Signed)
Pt aware that pap is abnormal and needs colpo, appt made with Dr Elonda Husky

## 2016-12-31 NOTE — Telephone Encounter (Signed)
Left message to call me back, if she does, pap is abnormal ,needs colpo with Dr Elonda Husky

## 2017-01-01 ENCOUNTER — Ambulatory Visit (HOSPITAL_COMMUNITY): Payer: Medicaid Other

## 2017-01-08 ENCOUNTER — Inpatient Hospital Stay (HOSPITAL_COMMUNITY): Admission: RE | Admit: 2017-01-08 | Payer: Medicaid Other | Source: Ambulatory Visit

## 2017-01-08 ENCOUNTER — Ambulatory Visit (INDEPENDENT_AMBULATORY_CARE_PROVIDER_SITE_OTHER): Payer: Medicaid Other | Admitting: Obstetrics & Gynecology

## 2017-01-08 ENCOUNTER — Encounter: Payer: Self-pay | Admitting: Obstetrics & Gynecology

## 2017-01-08 VITALS — BP 112/66 | HR 106 | Wt 131.0 lb

## 2017-01-08 DIAGNOSIS — N870 Mild cervical dysplasia: Secondary | ICD-10-CM

## 2017-01-08 NOTE — Progress Notes (Signed)
Colposcopy Procedure Note:  Colposcopy Procedure Note  Indications: Pap smear 1 months ago showed: low-grade squamous intraepithelial neoplasia (LGSIL - encompassing HPV,mild dysplasia,CIN I). The prior pap showed high-grade squamous intraepithelial neoplasia  (HGSIL-encompassing moderate and severe dysplasia).  Prior cervical/vaginal disease: CIN 1. Prior cervical treatment: laser surgery.conizatin with negative margins  Smoker:  No. New sexual partner:  No.  : time frame:    History of abnormal Pap: yes  Procedure Details  The risks and benefits of the procedure and Written informed consent obtained.  Speculum placed in vagina and excellent visualization of cervix achieved, cervix swabbed x 3 with acetic acid solution.  Findings: Cervix: dense acetowhite change at SCJ extending into canal; . Vaginal inspection: normal without visible lesions. Vulvar colposcopy: vulvar colposcopy not performed.  Specimens: none  Complications: none.  Plan: Follow up colpo and Pap in 6 months Suspicion pt will need to have hysterectomy

## 2017-01-12 ENCOUNTER — Ambulatory Visit (HOSPITAL_COMMUNITY)
Admission: RE | Admit: 2017-01-12 | Discharge: 2017-01-12 | Disposition: A | Discharge status: Home / Self Care | Payer: Medicaid Other | Source: Ambulatory Visit | Attending: Adult Health | Admitting: Adult Health

## 2017-01-12 DIAGNOSIS — Z1231 Encounter for screening mammogram for malignant neoplasm of breast: Secondary | ICD-10-CM | POA: Diagnosis not present

## 2017-03-17 ENCOUNTER — Emergency Department (HOSPITAL_COMMUNITY)
Admission: EM | Admit: 2017-03-17 | Discharge: 2017-03-17 | Disposition: A | Discharge status: Home Health | Payer: Medicaid Other | Attending: Emergency Medicine | Admitting: Emergency Medicine

## 2017-03-17 ENCOUNTER — Encounter (HOSPITAL_COMMUNITY): Payer: Self-pay

## 2017-03-17 DIAGNOSIS — M5442 Lumbago with sciatica, left side: Secondary | ICD-10-CM | POA: Diagnosis not present

## 2017-03-17 DIAGNOSIS — M5441 Lumbago with sciatica, right side: Secondary | ICD-10-CM | POA: Diagnosis not present

## 2017-03-17 DIAGNOSIS — Z79899 Other long term (current) drug therapy: Secondary | ICD-10-CM | POA: Insufficient documentation

## 2017-03-17 DIAGNOSIS — M5440 Lumbago with sciatica, unspecified side: Secondary | ICD-10-CM

## 2017-03-17 DIAGNOSIS — M79661 Pain in right lower leg: Secondary | ICD-10-CM | POA: Diagnosis present

## 2017-03-17 MED ORDER — DEXAMETHASONE SODIUM PHOSPHATE 4 MG/ML IJ SOLN
8.0000 mg | Freq: Once | INTRAMUSCULAR | Status: AC
  Administered 2017-03-17: 8 mg via INTRAMUSCULAR
  Filled 2017-03-17: qty 2

## 2017-03-17 MED ORDER — DEXAMETHASONE 4 MG PO TABS: 4.0000 mg | ORAL_TABLET | Freq: Two times a day (BID) | ORAL | 0 refills | Status: DC

## 2017-03-17 MED ORDER — METHOCARBAMOL 500 MG PO TABS
500.0000 mg | ORAL_TABLET | Freq: Once | ORAL | Status: AC
  Administered 2017-03-17: 500 mg via ORAL
  Filled 2017-03-17: qty 1

## 2017-03-17 MED ORDER — TRAMADOL HCL 50 MG PO TABS
50.0000 mg | ORAL_TABLET | Freq: Once | ORAL | Status: AC
  Administered 2017-03-17: 50 mg via ORAL
  Filled 2017-03-17: qty 1

## 2017-03-17 MED ORDER — METHOCARBAMOL 500 MG PO TABS: 500.0000 mg | ORAL_TABLET | Freq: Three times a day (TID) | ORAL | 0 refills | Status: DC

## 2017-03-17 MED ORDER — TRAMADOL HCL 50 MG PO TABS: 50.0000 mg | ORAL_TABLET | Freq: Four times a day (QID) | ORAL | 0 refills | Status: DC | PRN
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
## 2017-03-17 NOTE — ED Triage Notes (Signed)
Pt reports bilateral leg pain for 2 weeks. No injury. Pt reports pain is crampy and aches from thighs to bottom of legs. Reports mild swelling in knees

## 2017-03-17 NOTE — Discharge Instructions (Signed)
Your vital signs are within normal limits. Your examination suggest degenerative arthritis, or degenerative disc disease of the your lower back. Please see Dr. Legrand Rams for follow-up and management of your back and leg pain. You may need an MRI to further study the reason for ear pain. Please use Decadron, Robaxin, and Ultram daily. Ultram and Robaxin may cause drowsiness. Please use these medications cautiously.

## 2017-07-07 ENCOUNTER — Ambulatory Visit: Payer: Medicaid Other | Admitting: Obstetrics & Gynecology

## 2018-01-27 ENCOUNTER — Encounter (HOSPITAL_COMMUNITY): Payer: Self-pay

## 2018-01-27 ENCOUNTER — Other Ambulatory Visit: Payer: Self-pay | Admitting: Adult Health

## 2018-01-27 ENCOUNTER — Ambulatory Visit (HOSPITAL_COMMUNITY)
Admission: RE | Admit: 2018-01-27 | Discharge: 2018-01-27 | Disposition: A | Discharge status: Home / Self Care | Payer: Medicaid Other | Source: Ambulatory Visit | Attending: Adult Health | Admitting: Adult Health

## 2018-01-27 DIAGNOSIS — Z1231 Encounter for screening mammogram for malignant neoplasm of breast: Secondary | ICD-10-CM

## 2018-03-18 ENCOUNTER — Emergency Department (HOSPITAL_COMMUNITY)
Admission: EM | Admit: 2018-03-18 | Discharge: 2018-03-18 | Disposition: A | Discharge status: Home / Self Care | Payer: Medicaid Other | Attending: Emergency Medicine | Admitting: Emergency Medicine

## 2018-03-18 ENCOUNTER — Other Ambulatory Visit: Payer: Self-pay

## 2018-03-18 ENCOUNTER — Encounter (HOSPITAL_COMMUNITY): Payer: Self-pay | Admitting: Emergency Medicine

## 2018-03-18 DIAGNOSIS — Z79899 Other long term (current) drug therapy: Secondary | ICD-10-CM | POA: Insufficient documentation

## 2018-03-18 DIAGNOSIS — J029 Acute pharyngitis, unspecified: Secondary | ICD-10-CM | POA: Insufficient documentation

## 2018-03-18 MED ORDER — AMOXICILLIN 500 MG PO CAPS: 500.0000 mg | ORAL_CAPSULE | Freq: Three times a day (TID) | ORAL | 0 refills | Status: DC

## 2018-03-18 NOTE — ED Notes (Signed)
Pt with sore throat and body aches for 2 weeks.

## 2018-03-18 NOTE — ED Notes (Signed)
Instructed pt to take all of antibiotics as prescribed. 

## 2018-03-18 NOTE — Discharge Instructions (Signed)
Return if any problems.

## 2018-03-18 NOTE — ED Triage Notes (Signed)
Sore throat, body aches and cough x 2 weeks
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
## 2018-09-24 ENCOUNTER — Other Ambulatory Visit: Payer: Medicaid Other | Admitting: Adult Health

## 2018-12-20 ENCOUNTER — Other Ambulatory Visit (HOSPITAL_COMMUNITY): Payer: Self-pay | Admitting: Adult Health

## 2018-12-20 DIAGNOSIS — Z1231 Encounter for screening mammogram for malignant neoplasm of breast: Secondary | ICD-10-CM

## 2018-12-29 ENCOUNTER — Other Ambulatory Visit: Payer: Medicaid Other | Admitting: Obstetrics and Gynecology

## 2019-01-17 ENCOUNTER — Other Ambulatory Visit: Payer: Medicaid Other | Admitting: Adult Health

## 2019-02-17 ENCOUNTER — Ambulatory Visit (HOSPITAL_COMMUNITY): Payer: Medicaid Other

## 2019-03-02 ENCOUNTER — Ambulatory Visit (HOSPITAL_COMMUNITY): Payer: Medicaid Other

## 2019-03-14 ENCOUNTER — Other Ambulatory Visit: Payer: Medicaid Other | Admitting: Adult Health

## 2019-03-18 ENCOUNTER — Ambulatory Visit (HOSPITAL_COMMUNITY)
Admission: RE | Admit: 2019-03-18 | Discharge: 2019-03-18 | Disposition: A | Discharge status: Home / Self Care | Payer: Medicaid Other | Source: Ambulatory Visit | Attending: Adult Health | Admitting: Adult Health

## 2019-03-18 ENCOUNTER — Other Ambulatory Visit: Payer: Self-pay

## 2019-03-18 DIAGNOSIS — Z1231 Encounter for screening mammogram for malignant neoplasm of breast: Secondary | ICD-10-CM

## 2019-03-25 ENCOUNTER — Telehealth: Payer: Self-pay | Admitting: *Deleted

## 2019-03-25 ENCOUNTER — Telehealth: Payer: Self-pay | Admitting: Adult Health

## 2019-03-25 NOTE — Telephone Encounter (Signed)
Patient called stating that she had a Mammo done at [facility] and she got the results. on her results it states that she has dense breast and would like a call back. Please contact pt

## 2019-03-25 NOTE — Telephone Encounter (Signed)
Patient informed mammo was normal but she does have dense breasts which is common in women. No abnormalities were seen, so she is able to have next one in 1 year.  Pt verbalized understanding.

## 2019-04-01 ENCOUNTER — Telehealth: Payer: Self-pay | Admitting: Adult Health

## 2019-04-01 NOTE — Telephone Encounter (Signed)

## 2019-04-05 ENCOUNTER — Other Ambulatory Visit: Payer: Medicaid Other | Admitting: Adult Health

## 2020-04-05 ENCOUNTER — Other Ambulatory Visit (HOSPITAL_COMMUNITY): Payer: Self-pay | Admitting: Adult Health

## 2020-04-05 DIAGNOSIS — Z1231 Encounter for screening mammogram for malignant neoplasm of breast: Secondary | ICD-10-CM

## 2020-04-20 ENCOUNTER — Inpatient Hospital Stay (HOSPITAL_COMMUNITY): Admission: RE | Admit: 2020-04-20 | Payer: Medicaid Other | Source: Ambulatory Visit

## 2020-05-23 ENCOUNTER — Other Ambulatory Visit: Payer: Medicaid Other | Admitting: Adult Health

## 2020-06-07 ENCOUNTER — Ambulatory Visit: Payer: Medicaid Other | Attending: Internal Medicine

## 2020-06-07 DIAGNOSIS — Z23 Encounter for immunization: Secondary | ICD-10-CM

## 2020-06-07 NOTE — Progress Notes (Signed)
   Covid-19 Vaccination Clinic  Name:  Michelle Krueger    MRN: [id number] DOB: 07-Sep-1956  06/07/2020  [name] was observed post Covid-19 immunization for 15 minutes without incident. She was provided with Vaccine Information Sheet and instruction to access the V-Safe system.   [name] was instructed to call 911 with any severe reactions post vaccine: Marland Kitchen Difficulty breathing  . Swelling of face and throat  . A fast heartbeat  . A bad rash all over body  . Dizziness and weakness   Immunizations Administered    Name Date Dose VIS Date Route   Pfizer COVID-19 Vaccine 06/07/2020  9:29 AM 0.3 mL 12/07/2018 Intramuscular   Manufacturer: [place]   Lot: [id number]   [place]: [address]-7

## 2020-06-28 ENCOUNTER — Ambulatory Visit: Payer: Medicaid Other | Attending: Internal Medicine

## 2020-06-28 DIAGNOSIS — Z23 Encounter for immunization: Secondary | ICD-10-CM

## 2020-06-28 NOTE — Progress Notes (Signed)
° °  Covid-19 Vaccination Clinic  Name:  Michelle Krueger    MRN: [id number] DOB: [birth year]  06/28/2020  [name] was observed post Covid-19 immunization for 15 minutes without incident. She was provided with Vaccine Information Sheet and instruction to access the V-Safe system.   Ms. Blue was instructed to call 911 with any severe reactions post vaccine:  Difficulty breathing   Swelling of face and throat   A fast heartbeat   A bad rash all over body   Dizziness and weakness   Immunizations Administered    Name Date Dose VIS Date Route   Pfizer COVID-19 Vaccine 06/28/2020  9:26 AM 0.3 mL 12/07/2018 Intramuscular   Manufacturer: [place]   Lot: [place]   [place]: [id number]

## 2020-08-06 ENCOUNTER — Other Ambulatory Visit (HOSPITAL_COMMUNITY): Payer: Self-pay | Admitting: Adult Health

## 2020-08-06 DIAGNOSIS — Z1231 Encounter for screening mammogram for malignant neoplasm of breast: Secondary | ICD-10-CM

## 2020-08-31 ENCOUNTER — Inpatient Hospital Stay (HOSPITAL_COMMUNITY): Admission: RE | Admit: 2020-08-31 | Payer: Medicaid Other | Source: Ambulatory Visit

## 2020-09-03 ENCOUNTER — Other Ambulatory Visit: Payer: Medicaid Other | Admitting: Advanced Practice Midwife

## 2020-09-17 ENCOUNTER — Other Ambulatory Visit: Payer: Medicaid Other | Admitting: Obstetrics & Gynecology

## 2020-09-19 ENCOUNTER — Other Ambulatory Visit: Payer: Self-pay

## 2020-09-19 ENCOUNTER — Ambulatory Visit (HOSPITAL_COMMUNITY)
Admission: RE | Admit: 2020-09-19 | Discharge: 2020-09-19 | Disposition: A | Discharge status: Home / Self Care | Payer: Medicaid Other | Source: Ambulatory Visit | Attending: Adult Health | Admitting: Adult Health

## 2020-09-19 DIAGNOSIS — Z1231 Encounter for screening mammogram for malignant neoplasm of breast: Secondary | ICD-10-CM | POA: Insufficient documentation

## 2020-10-24 ENCOUNTER — Other Ambulatory Visit: Payer: Medicaid Other | Admitting: Adult Health

## 2020-12-17 ENCOUNTER — Other Ambulatory Visit: Payer: Self-pay

## 2020-12-17 ENCOUNTER — Encounter: Payer: Self-pay | Admitting: Adult Health

## 2020-12-17 ENCOUNTER — Other Ambulatory Visit (HOSPITAL_COMMUNITY)
Admission: RE | Admit: 2020-12-17 | Discharge: 2020-12-17 | Disposition: A | Discharge status: Home / Self Care | Payer: Medicaid Other | Source: Ambulatory Visit | Attending: Adult Health | Admitting: Adult Health

## 2020-12-17 ENCOUNTER — Ambulatory Visit (INDEPENDENT_AMBULATORY_CARE_PROVIDER_SITE_OTHER): Payer: Medicaid Other | Admitting: Adult Health

## 2020-12-17 VITALS — BP 121/79 | HR 70 | Ht 60.25 in | Wt 132.0 lb

## 2020-12-17 DIAGNOSIS — Z1211 Encounter for screening for malignant neoplasm of colon: Secondary | ICD-10-CM | POA: Diagnosis not present

## 2020-12-17 DIAGNOSIS — Z01419 Encounter for gynecological examination (general) (routine) without abnormal findings: Secondary | ICD-10-CM | POA: Insufficient documentation

## 2020-12-17 DIAGNOSIS — Z8742 Personal history of other diseases of the female genital tract: Secondary | ICD-10-CM

## 2020-12-17 DIAGNOSIS — Z9889 Other specified postprocedural states: Secondary | ICD-10-CM

## 2020-12-17 DIAGNOSIS — Z780 Asymptomatic menopausal state: Secondary | ICD-10-CM | POA: Diagnosis not present

## 2020-12-17 LAB — HEMOCCULT GUIAC POC 1CARD (OFFICE): Fecal Occult Blood, POC: NEGATIVE

## 2020-12-17 NOTE — Progress Notes (Signed)
Patient ID: [name], female   DOB: 1956/05/02, 65 y.o.   MRN: [id number] History of Present Illness:  Michelle Krueger is a 65 year old black female,single, PM in for a well woman gyn exam and pap. Her last pap was 12/25/16 LSIL. She had colpo 3/289/18, was to follow up in 6 months but did not. PCP is Dr Legrand Rams.  Current Medications, Allergies, Past Medical History, Past Surgical History, Family History and Social History were reviewed in [employer] record.     Review of Systems: Patient denies any headaches, hearing loss, fatigue, blurred vision, shortness of breath, chest pain, abdominal pain, problems with bowel movements, urination, or intercourse.(not having sex) No joint pain or mood swings.    Physical Exam:BP 121/79 (BP Location: Right Arm, Patient Position: Sitting, Cuff Size: Normal)   Pulse 70   Ht 5' 0.25" (1.53 m)   Wt 132 lb (59.9 kg)   LMP 04/29/2011   BMI 25.57 kg/m  General:  Well developed, well nourished, no acute distress Skin:  Warm and dry Neck:  Midline trachea, normal thyroid, good ROM, no lymphadenopathy,no carotid bruits heard.  Lungs; Clear to auscultation bilaterally Breast:  No dominant palpable mass, retraction, or nipple discharge Cardiovascular: Regular rate and rhythm Abdomen:  Soft, non tender, no hepatosplenomegaly Pelvic:  External genitalia is normal in appearance, no lesions.  The vagina is pale with loss of moisture and rugae. Urethra has no lesions or masses. The cervix is smooth,pap with HRHPV genotyping performed.  Uterus is felt to be normal size, shape, and contour.  No adnexal masses or tenderness noted.Bladder is non tender, no masses felt. Rectal: Good sphincter tone, no polyps, or hemorrhoids felt.  Hemoccult negative. Extremities/musculoskeletal:  No swelling or varicosities noted, no clubbing or cyanosis Psych:  No mood changes, alert and cooperative,seems happy AA is 3 Fall risk is low PHQ 9 score is 0 GAD 7  score is 0 Examination chaperoned by Celene Squibb LPN  [place] - 73/22/02 0929      Pregnancy Intention Screening   Does the patient want to become pregnant in the next year? N/A    Does the patient's partner want to become pregnant in the next year? N/A    Would the patient like to discuss contraceptive options today? N/A      Contraception Wrap Up   Current Method Female Sterilization   PM   End Method Female Sterilization   PM   Contraception Counseling Provided No         She said is good on food and transportation when asked, does have to borrow son's car occasionally.  Impression and Pap: 1. Encounter for gynecological examination with Papanicolaou smear of cervix Pap sent Physical in 1 year with Dr Legrand Rams Pap in 3 if normal Mammogram yearly Colonoscopy per GI Labs with PCp  2. Encounter for screening fecal occult blood testing  3. Postmenopausal  4. History of abnormal cervical Pap smear Pap sent   5. S/P conization of cervix

## 2020-12-20 ENCOUNTER — Telehealth: Payer: Self-pay | Admitting: Adult Health

## 2020-12-20 DIAGNOSIS — R87612 Low grade squamous intraepithelial lesion on cytologic smear of cervix (LGSIL): Secondary | ICD-10-CM

## 2020-12-20 LAB — CYTOLOGY - PAP
Adequacy: ABSENT
Comment: NEGATIVE
High risk HPV: NEGATIVE

## 2020-12-20 NOTE — Telephone Encounter (Signed)
Left message to give me a call, if she calls, tell her to repeat pap in 1 year instead of 3

## 2021-08-13 ENCOUNTER — Other Ambulatory Visit (HOSPITAL_COMMUNITY): Payer: Self-pay | Admitting: Internal Medicine

## 2021-08-13 DIAGNOSIS — Z1231 Encounter for screening mammogram for malignant neoplasm of breast: Secondary | ICD-10-CM

## 2021-09-23 ENCOUNTER — Other Ambulatory Visit: Payer: Self-pay

## 2021-09-23 ENCOUNTER — Ambulatory Visit (HOSPITAL_COMMUNITY)
Admission: RE | Admit: 2021-09-23 | Discharge: 2021-09-23 | Disposition: A | Discharge status: Home / Self Care | Payer: Medicaid Other | Source: Ambulatory Visit | Attending: Internal Medicine | Admitting: Internal Medicine

## 2021-09-23 DIAGNOSIS — Z1231 Encounter for screening mammogram for malignant neoplasm of breast: Secondary | ICD-10-CM | POA: Insufficient documentation

## 2022-06-30 ENCOUNTER — Encounter (HOSPITAL_COMMUNITY): Payer: Self-pay | Admitting: Emergency Medicine

## 2022-06-30 ENCOUNTER — Emergency Department (HOSPITAL_COMMUNITY)
Admission: EM | Admit: 2022-06-30 | Discharge: 2022-06-30 | Disposition: A | Discharge status: Home / Self Care | Payer: Medicaid Other | Attending: Emergency Medicine | Admitting: Emergency Medicine

## 2022-06-30 ENCOUNTER — Emergency Department (HOSPITAL_COMMUNITY): Payer: Medicaid Other

## 2022-06-30 ENCOUNTER — Other Ambulatory Visit: Payer: Self-pay

## 2022-06-30 DIAGNOSIS — S60222A Contusion of left hand, initial encounter: Secondary | ICD-10-CM | POA: Diagnosis not present

## 2022-06-30 DIAGNOSIS — W228XXA Striking against or struck by other objects, initial encounter: Secondary | ICD-10-CM | POA: Insufficient documentation

## 2022-06-30 DIAGNOSIS — S6992XA Unspecified injury of left wrist, hand and finger(s), initial encounter: Secondary | ICD-10-CM | POA: Diagnosis present

## 2022-06-30 NOTE — ED Triage Notes (Signed)
Pt to the ED with left hand pain after hitting it causing swelling.

## 2022-06-30 NOTE — ED Provider Notes (Signed)
[facility] Provider Note   CSN: [id number] Arrival date & time: 06/30/22  1330     History  Chief Complaint  Patient presents with   Hand Pain    Michelle Krueger is a 66 y.o. female.  The history is provided by the patient. No language interpreter was used.  Hand Pain This is a new problem. The current episode started yesterday. The problem occurs constantly. The problem has not changed since onset.Nothing aggravates the symptoms. Nothing relieves the symptoms. She has tried nothing for the symptoms. The treatment provided moderate relief.       Home Medications Prior to Admission medications   Not on File      Allergies    Chocolate and Ibuprofen    Review of Systems   Review of Systems  All other systems reviewed and are negative.   Physical Exam Updated Vital Signs BP 132/83 (BP Location: Right Arm)   Pulse 75   Temp 97.7 F (36.5 C) (Oral)   Resp 19   Ht '4\' 9"'$  (1.448 m)   Wt 56.7 kg   LMP 04/29/2011   SpO2 96%   BMI 27.05 kg/m  Physical Exam Vitals reviewed.  Constitutional:      Appearance: Normal appearance.  Cardiovascular:     Rate and Rhythm: Normal rate.  Pulmonary:     Effort: Pulmonary effort is normal.  Musculoskeletal:        General: Swelling and tenderness present.     Comments: Swollen tender left hand,  pain with movement,  from  nv and ns intact   Skin:    General: Skin is warm.  Neurological:     General: No focal deficit present.     Mental Status: She is alert.  Psychiatric:        Mood and Affect: Mood normal.     ED Results / Procedures / Treatments   Labs (all labs ordered are listed, but only abnormal results are displayed) Labs Reviewed - No data to display  EKG None  Radiology DG Hand Complete Left  Result Date: 06/30/2022 CLINICAL DATA:  Hand injury, pain and swelling EXAM: LEFT HAND - COMPLETE 3  VIEW COMPARISON:  No prior hand radiographs correlation is made with some radiographs  03/28/2005 FINDINGS: No acute fracture or dislocation.The joint spaces are preserved.Alignment is unremarkable.No significant soft tissue abnormality or foreign body. IMPRESSION: No acute osseous abnormality. Electronically Signed   By: Merilyn Baba M.D.   On: 06/30/2022 14:50    Procedures Procedures    Medications Ordered in ED Medications - No data to display  ED Course/ Medical Decision Making/ A&P                           Medical Decision Making Pt accidentally hit her hand with a hammer yesterday   Amount and/or Complexity of Data Reviewed External Data Reviewed: notes.    Details: Gyn notes reviewed,  Radiology: ordered and independent interpretation performed. Decision-making details documented in ED Course.    Details: Xray  left hand,  no fracture.   Risk Risk Details: Xray no fracture,  Pt advised to follow up with primary care in 1 week if pain persist            Final Clinical Impression(s) / ED Diagnoses Final diagnoses:  Contusion of left hand, initial encounter    Rx / DC Orders ED Discharge Orders     None  An After Visit Summary was printed and given to the patient.     Sidney Ace 06/30/22 1502    Elgie Congo, MD 06/30/22 1901

## 2022-07-02 ENCOUNTER — Encounter: Payer: Self-pay | Admitting: *Deleted

## 2022-09-10 ENCOUNTER — Other Ambulatory Visit (HOSPITAL_COMMUNITY): Payer: Self-pay | Admitting: Internal Medicine

## 2022-09-10 DIAGNOSIS — Z1231 Encounter for screening mammogram for malignant neoplasm of breast: Secondary | ICD-10-CM

## 2022-09-26 ENCOUNTER — Ambulatory Visit (HOSPITAL_COMMUNITY)
Admission: RE | Admit: 2022-09-26 | Discharge: 2022-09-26 | Disposition: A | Discharge status: Home / Self Care | Payer: Medicaid Other | Source: Ambulatory Visit | Attending: Internal Medicine | Admitting: Internal Medicine

## 2022-09-26 DIAGNOSIS — Z1231 Encounter for screening mammogram for malignant neoplasm of breast: Secondary | ICD-10-CM | POA: Insufficient documentation

## 2023-06-08 ENCOUNTER — Encounter: Payer: Self-pay | Admitting: *Deleted

## 2023-07-03 IMAGING — MG MM DIGITAL SCREENING BILAT W/ TOMO AND CAD
6 of 10 series · 6 of 30 positions shown · non-contrast
Comparison: Previous exam(s).

CLINICAL DATA: Screening.

EXAM:
DIGITAL SCREENING BILATERAL MAMMOGRAM WITH TOMOSYNTHESIS AND CAD
TECHNIQUE: Bilateral screening digital craniocaudal and mediolateral oblique
mammograms were obtained. Bilateral screening digital breast
tomosynthesis was performed. The images were evaluated with
computer-aided detection.

[L CC synth-2D]
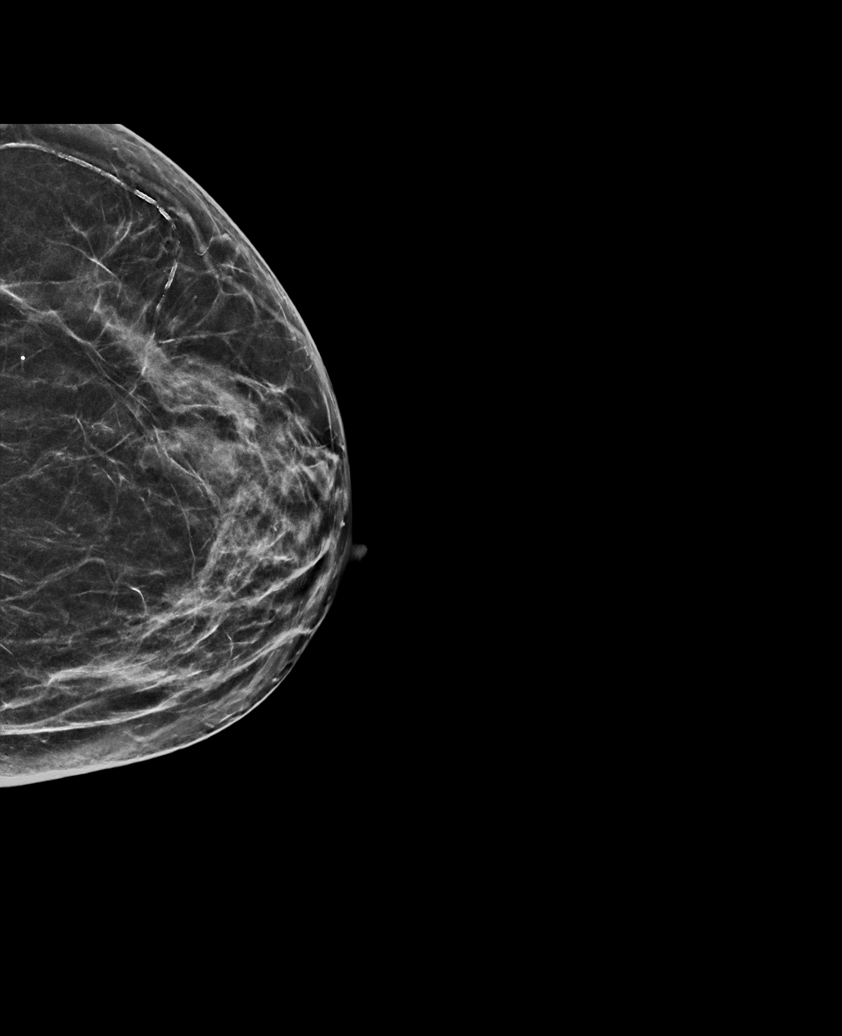

[R CC synth-2D]
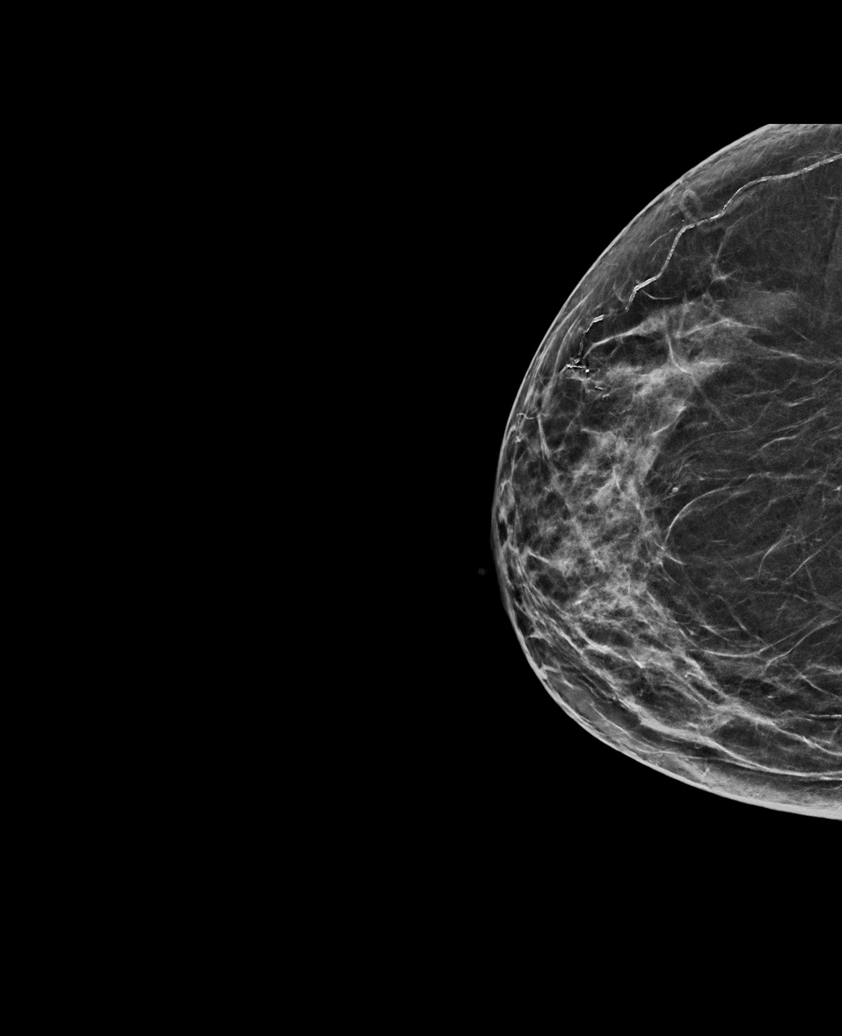

[R MLO synth-2D (1 of 2)]
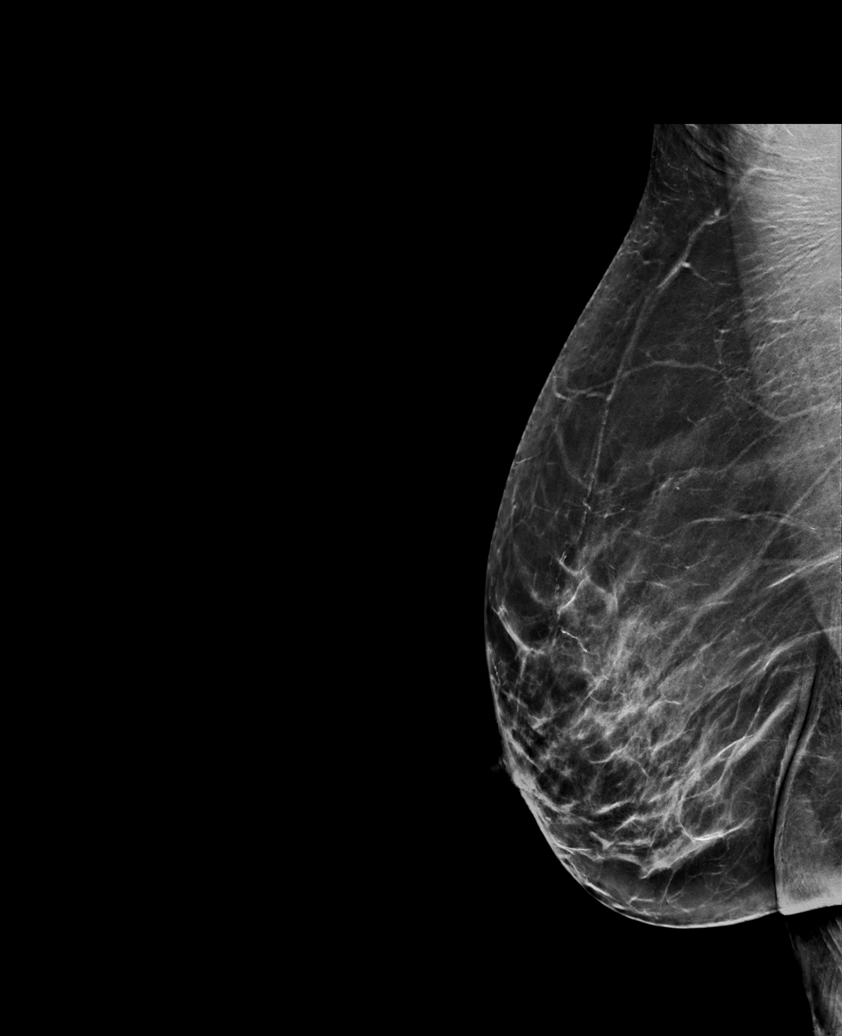

[R MLO synth-2D (2 of 2)]
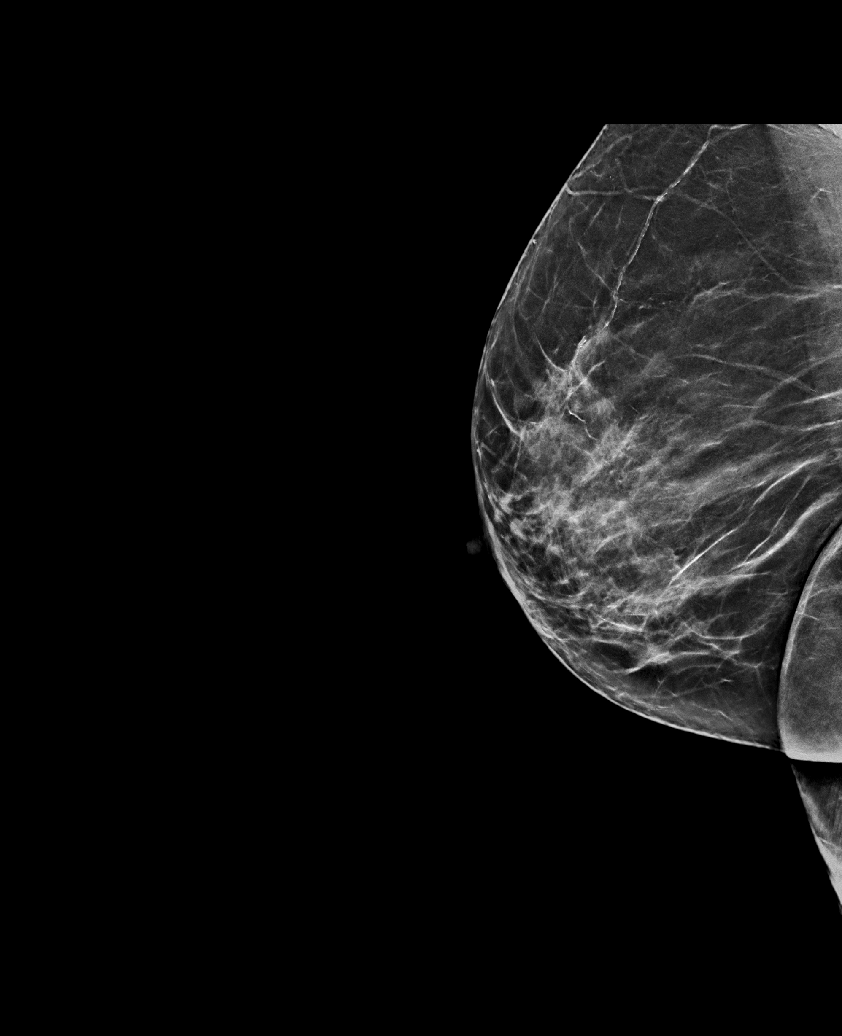

[L MLO synth-2D]
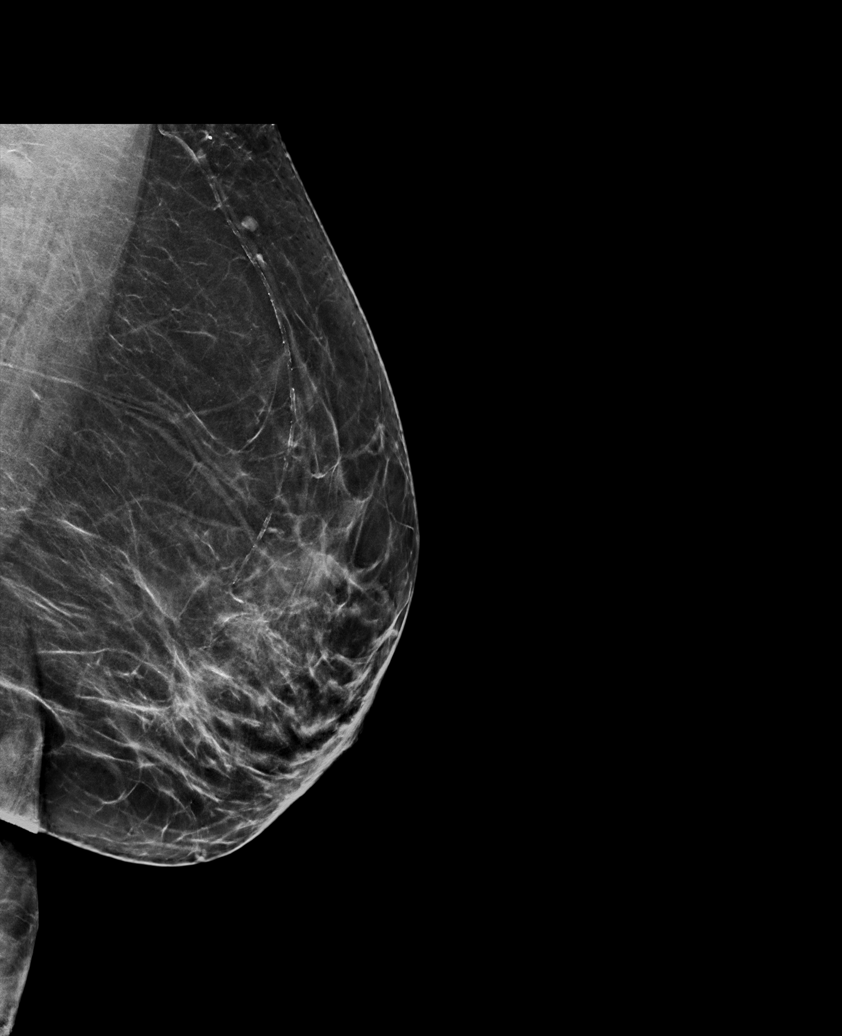

[L MLO tomo · tomo slice 37/72.0]
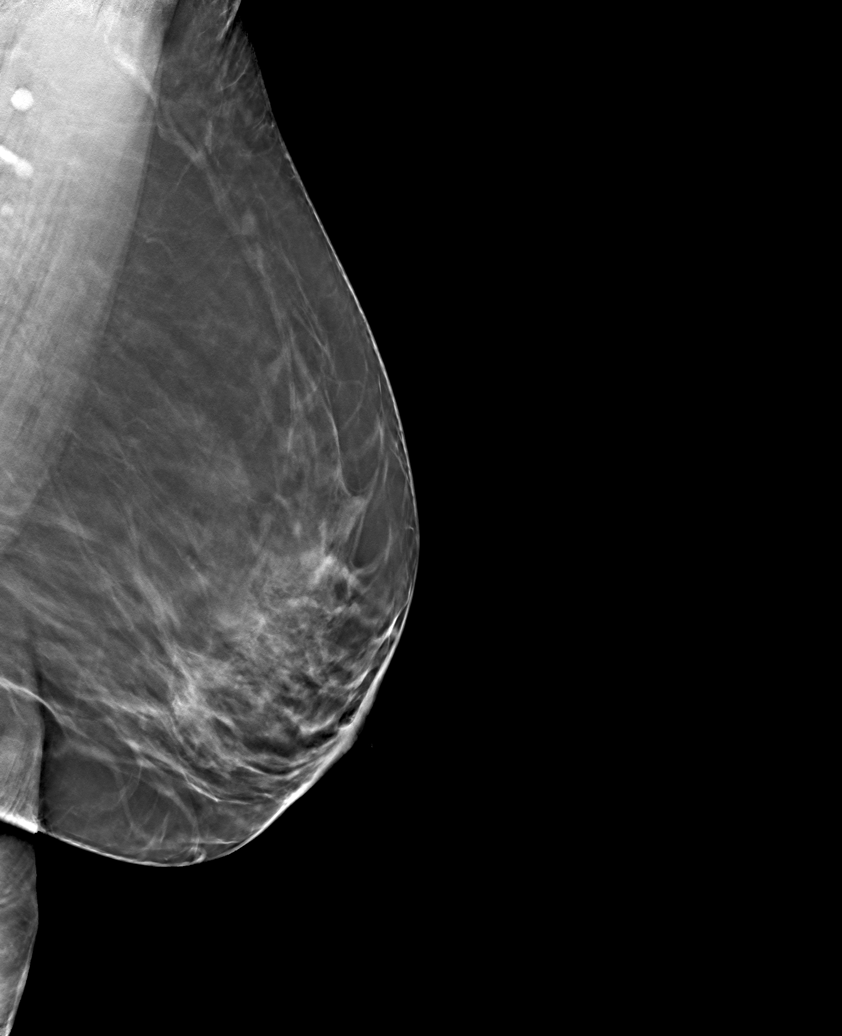

[6 of 30 positions shown; findings below may reference images not displayed]

ACR Breast Density Category b: There are scattered areas of
fibroglandular density.
FINDINGS: There are no findings suspicious for malignancy.
IMPRESSION: No mammographic evidence of malignancy. A result letter of this
screening mammogram will be mailed directly to the patient.

RECOMMENDATION:
Screening mammogram in one year. (Code:51-O-LD2)

BI-RADS CATEGORY  1: Negative.

## 2023-07-21 ENCOUNTER — Telehealth: Payer: Self-pay | Admitting: Internal Medicine

## 2023-07-21 NOTE — Telephone Encounter (Signed)
  Procedure: Colonoscopy  Height: 4'9 Weight: 130lbs        Have you had a colonoscopy before?  Marked no but epic shows 07/16/12, Dr. Darrick Penna  Do you have family history of colon cancer?  no  Do you have a family history of polyps? no  Previous colonoscopy with polyps removed? no  Do you have a history colorectal cancer?   no  Are you diabetic?  no  Do you have a prosthetic or mechanical heart valve? no  Do you have a pacemaker/defibrillator?   no  Have you had endocarditis/atrial fibrillation?  no  Do you use supplemental oxygen/CPAP?  no  Have you had joint replacement within the last 12 months?  no  Do you tend to be constipated or have to use laxatives?  no   Do you have history of alcohol use? If yes, how much and how often.  no  Do you have history or are you using drugs? If yes, what do are you  using?  no  Have you ever had a stroke/heart attack?  no  Have you ever had a heart or other vascular stent placed,?no  Do you take weight loss medication? no  female patients,: have you had a hysterectomy? no                              are you post menopausal?  no                              do you still have your menstrual cycle? no    Date of last menstrual period?   Do you take any blood-thinning medications such as: (Plavix, aspirin, Coumadin, Aggrenox, Brilinta, Xarelto, Eliquis, Pradaxa, Savaysa or Effient)? no  If yes we need the name, milligram, dosage and who is prescribing doctor:               Current Outpatient Medications  Medication Sig Dispense Refill   amLODipine (NORVASC) 5 MG tablet Take 5 mg by mouth daily.     atorvastatin (LIPITOR) 10 MG tablet Take 10 mg by mouth daily.     No current facility-administered medications for this visit.    Allergies  Allergen Reactions   Chocolate Rash   Ibuprofen Rash

## 2023-07-21 NOTE — Addendum Note (Signed)
Addended by: Armstead Peaks on: 07/21/2023 04:38 PM   Modules accepted: Orders

## 2023-07-21 NOTE — Telephone Encounter (Signed)
Questionnaire in review

## 2023-07-22 NOTE — Telephone Encounter (Signed)
Will call pt to schedule once we have future schedule for Dr. Marletta Lor

## 2023-07-22 NOTE — Telephone Encounter (Signed)
LMOVM to call back 

## 2023-07-29 NOTE — Telephone Encounter (Signed)
LMTCB. Letter mailed ?

## 2023-08-04 NOTE — Telephone Encounter (Signed)
Noted in referral.

## 2023-08-05 ENCOUNTER — Telehealth: Payer: Self-pay | Admitting: Internal Medicine

## 2023-08-05 NOTE — Telephone Encounter (Signed)
Patient left a message saying she needed to schedule an appointment.  I see all of the messages where she called, Rico Ala put in messages that patient was going to call back.

## 2023-08-05 NOTE — Telephone Encounter (Signed)
Called, LMTCB 

## 2023-08-07 ENCOUNTER — Telehealth: Payer: Self-pay | Admitting: Internal Medicine

## 2023-08-07 NOTE — Telephone Encounter (Signed)
Patient returned your call - she left a message on main # voice mail

## 2023-08-10 NOTE — Telephone Encounter (Signed)
LMTRC

## 2023-08-10 NOTE — Telephone Encounter (Signed)
Pt left vm returning call  LMTRC 

## 2023-08-20 ENCOUNTER — Encounter: Payer: Self-pay | Admitting: *Deleted

## 2023-08-20 ENCOUNTER — Telehealth: Payer: Self-pay | Admitting: Internal Medicine

## 2023-08-20 ENCOUNTER — Other Ambulatory Visit: Payer: Self-pay | Admitting: *Deleted

## 2023-08-20 ENCOUNTER — Other Ambulatory Visit (HOSPITAL_COMMUNITY): Payer: Self-pay | Admitting: Internal Medicine

## 2023-08-20 DIAGNOSIS — Z1231 Encounter for screening mammogram for malignant neoplasm of breast: Secondary | ICD-10-CM

## 2023-08-20 MED ORDER — PEG 3350-KCL-NA BICARB-NACL 420 G PO SOLR: 4000.0000 mL | Freq: Once | ORAL | 0 refills | Status: AC

## 2023-08-20 NOTE — Telephone Encounter (Signed)
See previous TE

## 2023-08-20 NOTE — Telephone Encounter (Signed)
Patient left a message that she needed to schedule an appt and I saw where you had left her a message to call back

## 2023-08-20 NOTE — Telephone Encounter (Signed)
Pt has been scheduled for 09/25/23 with Dr.Carver. instructions mailed and prep sent to the pharmacy

## 2023-08-20 NOTE — Telephone Encounter (Signed)
Michelle Krueger A1 minute ago (11:27 AM)    Patient left a message that she needed to schedule an appt and I saw where you had left her a message to call back

## 2023-08-21 ENCOUNTER — Encounter (INDEPENDENT_AMBULATORY_CARE_PROVIDER_SITE_OTHER): Payer: Self-pay | Admitting: *Deleted

## 2023-08-21 NOTE — Telephone Encounter (Signed)
Referral completed, [facility] apt letter sent to PCP

## 2023-09-25 ENCOUNTER — Encounter (HOSPITAL_COMMUNITY): Admission: RE | Payer: Self-pay | Source: Home / Self Care

## 2023-09-25 ENCOUNTER — Encounter (HOSPITAL_COMMUNITY): Payer: Self-pay | Admitting: Anesthesiology

## 2023-09-25 ENCOUNTER — Ambulatory Visit (HOSPITAL_COMMUNITY): Admission: RE | Admit: 2023-09-25 | Payer: Medicare Other | Source: Home / Self Care

## 2023-09-25 SURGERY — COLONOSCOPY WITH PROPOFOL
Anesthesia: Monitor Anesthesia Care

## 2023-10-02 ENCOUNTER — Ambulatory Visit (HOSPITAL_COMMUNITY): Payer: Medicare Other

## 2023-11-18 ENCOUNTER — Ambulatory Visit (HOSPITAL_COMMUNITY): Payer: Medicare Other

## 2024-02-04 ENCOUNTER — Encounter: Payer: Self-pay | Admitting: *Deleted

## 2024-05-03 ENCOUNTER — Telehealth: Payer: Self-pay

## 2024-05-03 NOTE — Telephone Encounter (Signed)
 Who is your primary care physician: Tesfaye D'tanta  Reasons for the colonoscopy: screening   Have you had a colonoscopy before?  no  Do you have family history of colon cancer? no  Previous colonoscopy with polyps removed? no  Do you have a history colorectal cancer?   no  Are you diabetic? If yes, Type 1 or Type 2?    no  Do you have a prosthetic or mechanical heart valve? no  Do you have a pacemaker/defibrillator?   no  Have you had endocarditis/atrial fibrillation? no  Have you had joint replacement within the last 12 months?  no  Do you tend to be constipated or have to use laxatives? no  Do you have any history of drugs or alchohol?  Yes 1 beer a week  Do you use supplemental oxygen?  no  Have you had a stroke or heart attack within the last 6 months? Not answered  Do you take weight loss medication?  no  For female patients: have you had a hysterectomy?  no                                     are you post menopausal?       yes                                            do you still have your menstrual cycle? no      Do you take any blood-thinning medications such as: (aspirin, warfarin, Plavix, Aggrenox)  no  If yes we need the name, milligram, dosage and who is prescribing doctor  Current Outpatient Medications on File Prior to Visit  Medication Sig Dispense Refill   amLODipine (NORVASC) 5 MG tablet Take 5 mg by mouth daily.     atorvastatin (LIPITOR) 10 MG tablet Take 10 mg by mouth daily.     No current facility-administered medications on file prior to visit.    Allergies  Allergen Reactions   Chocolate Rash   Ibuprofen  Rash     Pharmacy: [facility] Name: Medicaid [place] [id number] and Medicare [id number]   Best number where you can be reached: [phone]

## 2024-06-08 NOTE — Telephone Encounter (Signed)
 Should be okay to schedule.  ASA 2.  However, need to confirm the answer to the question, Have you had a stroke or heart attack within the last 6 months? as patient did not answer this question.  As long as the answer is no, we can proceed with scheduling.

## 2024-06-08 NOTE — Telephone Encounter (Signed)
 LMOVM to return call.

## 2024-06-23 ENCOUNTER — Encounter: Payer: Self-pay | Admitting: *Deleted

## 2024-06-23 NOTE — Telephone Encounter (Signed)
 Letter mailed

## 2024-06-23 NOTE — Telephone Encounter (Signed)
 Attempted to contact pt, message states call can not be completed at this time, try your call again later

## 2024-07-04 NOTE — Telephone Encounter (Signed)
 LMOVM to return call   Pt left vm to schedule procedure.

## 2024-10-16 ENCOUNTER — Ambulatory Visit
Admission: EM | Admit: 2024-10-16 | Discharge: 2024-10-16 | Disposition: A | Discharge status: Home / Self Care | Attending: Nurse Practitioner | Admitting: Nurse Practitioner

## 2024-10-16 DIAGNOSIS — T7840XA Allergy, unspecified, initial encounter: Secondary | ICD-10-CM

## 2024-10-16 DIAGNOSIS — L253 Unspecified contact dermatitis due to other chemical products: Secondary | ICD-10-CM

## 2024-10-16 MED ORDER — HYDROCORTISONE 2.5 % EX OINT: TOPICAL_OINTMENT | Freq: Two times a day (BID) | CUTANEOUS | 0 refills | Status: AC

## 2024-10-16 MED ORDER — DEXAMETHASONE SOD PHOSPHATE PF 10 MG/ML IJ SOLN
10.0000 mg | Freq: Once | INTRAMUSCULAR | Status: AC
  Administered 2024-10-16: 10 mg via INTRAMUSCULAR

## 2024-10-16 MED ORDER — PREDNISONE 20 MG PO TABS: 40.0000 mg | ORAL_TABLET | Freq: Every day | ORAL | 0 refills | Status: AC
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
## 2024-10-16 NOTE — ED Triage Notes (Signed)
 Pt states rash to scalp,forehead ears and back after dying her hair 3 days ago.

## 2024-10-16 NOTE — Discharge Instructions (Addendum)
 You were given injection of Decadron  10 mg.  Start the prednisone  tomorrow. Take medication as prescribed.  Start the prednisone  tomorrow. You may take over-the-counter Zyrtec, Allegra, or Claritin to help with itching. Apply cool compresses to the affected area as needed to help with itching.  Avoid scratching or manipulating the areas while symptoms persist. Go to the emergency department if you develop worsening rash, shortness of breath, difficulty breathing, chest pain, or other concerns. Follow-up as needed.
